# Patient Record
Sex: Female | Born: 1935 | Race: White | Hispanic: No | Smoking: Never smoker
Health system: Southern US, Community
[De-identification: ages and names within clinical notes are randomized; demographics above are authoritative.]

## PROBLEM LIST (undated history)

## (undated) DIAGNOSIS — R9089 Other abnormal findings on diagnostic imaging of central nervous system: Secondary | ICD-10-CM

## (undated) DIAGNOSIS — I639 Cerebral infarction, unspecified: Secondary | ICD-10-CM

## (undated) DIAGNOSIS — F0390 Unspecified dementia without behavioral disturbance: Secondary | ICD-10-CM

## (undated) DIAGNOSIS — I2510 Atherosclerotic heart disease of native coronary artery without angina pectoris: Secondary | ICD-10-CM

## (undated) DIAGNOSIS — G8190 Hemiplegia, unspecified affecting unspecified side: Secondary | ICD-10-CM

## (undated) DIAGNOSIS — J189 Pneumonia, unspecified organism: Secondary | ICD-10-CM

## (undated) DIAGNOSIS — R413 Other amnesia: Secondary | ICD-10-CM

## (undated) DIAGNOSIS — H3530 Unspecified macular degeneration: Secondary | ICD-10-CM

## (undated) DIAGNOSIS — G459 Transient cerebral ischemic attack, unspecified: Secondary | ICD-10-CM

## (undated) DIAGNOSIS — I6529 Occlusion and stenosis of unspecified carotid artery: Secondary | ICD-10-CM

## (undated) DIAGNOSIS — R931 Abnormal findings on diagnostic imaging of heart and coronary circulation: Secondary | ICD-10-CM

## (undated) DIAGNOSIS — E785 Hyperlipidemia, unspecified: Secondary | ICD-10-CM

## (undated) DIAGNOSIS — I10 Essential (primary) hypertension: Secondary | ICD-10-CM

## (undated) DIAGNOSIS — R930 Abnormal findings on diagnostic imaging of skull and head, not elsewhere classified: Secondary | ICD-10-CM

## (undated) DIAGNOSIS — L0390 Cellulitis, unspecified: Secondary | ICD-10-CM

## (undated) DIAGNOSIS — F329 Major depressive disorder, single episode, unspecified: Secondary | ICD-10-CM

## (undated) DIAGNOSIS — F32A Depression, unspecified: Secondary | ICD-10-CM

## (undated) HISTORY — DX: Other amnesia: R41.3

## (undated) HISTORY — PX: OTHER SURGICAL HISTORY: SHX169

## (undated) HISTORY — DX: Cellulitis, unspecified: L03.90

## (undated) HISTORY — DX: Pneumonia, unspecified organism: J18.9

## (undated) HISTORY — DX: Abnormal findings on diagnostic imaging of skull and head, not elsewhere classified: R93.0

## (undated) HISTORY — DX: Atherosclerotic heart disease of native coronary artery without angina pectoris: I25.10

## (undated) HISTORY — DX: Cerebral infarction, unspecified: I63.9

## (undated) HISTORY — PX: APPENDECTOMY: SHX54

## (undated) HISTORY — DX: Hyperlipidemia, unspecified: E78.5

## (undated) HISTORY — DX: Abnormal findings on diagnostic imaging of heart and coronary circulation: R93.1

## (undated) HISTORY — DX: Essential (primary) hypertension: I10

## (undated) HISTORY — DX: Unspecified macular degeneration: H35.30

## (undated) HISTORY — DX: Transient cerebral ischemic attack, unspecified: G45.9

## (undated) HISTORY — DX: Other abnormal findings on diagnostic imaging of central nervous system: R90.89

---

## 1958-03-11 DIAGNOSIS — I10 Essential (primary) hypertension: Secondary | ICD-10-CM

## 1958-03-11 HISTORY — DX: Essential (primary) hypertension: I10

## 1997-08-05 ENCOUNTER — Ambulatory Visit (HOSPITAL_COMMUNITY): Admission: RE | Admit: 1997-08-05 | Discharge: 1997-08-05 | Payer: Self-pay | Admitting: *Deleted

## 1997-09-01 ENCOUNTER — Ambulatory Visit (HOSPITAL_COMMUNITY): Admission: RE | Admit: 1997-09-01 | Discharge: 1997-09-01 | Payer: Self-pay | Admitting: Family Medicine

## 1998-08-11 ENCOUNTER — Ambulatory Visit (HOSPITAL_COMMUNITY): Admission: RE | Admit: 1998-08-11 | Discharge: 1998-08-11 | Payer: Self-pay | Admitting: *Deleted

## 1998-09-04 ENCOUNTER — Ambulatory Visit (HOSPITAL_COMMUNITY): Admission: RE | Admit: 1998-09-04 | Discharge: 1998-09-04 | Payer: Self-pay | Admitting: Family Medicine

## 1999-03-12 DIAGNOSIS — I639 Cerebral infarction, unspecified: Secondary | ICD-10-CM

## 1999-03-12 HISTORY — DX: Cerebral infarction, unspecified: I63.9

## 1999-04-27 ENCOUNTER — Inpatient Hospital Stay (HOSPITAL_COMMUNITY): Admission: EM | Admit: 1999-04-27 | Discharge: 1999-05-01 | Payer: Self-pay | Admitting: Emergency Medicine

## 1999-04-27 ENCOUNTER — Encounter: Payer: Self-pay | Admitting: Emergency Medicine

## 1999-04-28 ENCOUNTER — Encounter: Payer: Self-pay | Admitting: *Deleted

## 1999-07-27 ENCOUNTER — Ambulatory Visit (HOSPITAL_COMMUNITY): Admission: RE | Admit: 1999-07-27 | Discharge: 1999-07-27 | Payer: Self-pay | Admitting: Obstetrics & Gynecology

## 1999-09-20 ENCOUNTER — Ambulatory Visit (HOSPITAL_COMMUNITY): Admission: RE | Admit: 1999-09-20 | Discharge: 1999-09-20 | Payer: Self-pay | Admitting: Obstetrics & Gynecology

## 2000-04-07 ENCOUNTER — Encounter: Payer: Self-pay | Admitting: Orthopaedic Surgery

## 2000-04-07 ENCOUNTER — Ambulatory Visit (HOSPITAL_COMMUNITY): Admission: RE | Admit: 2000-04-07 | Discharge: 2000-04-07 | Payer: Self-pay | Admitting: Orthopaedic Surgery

## 2000-04-21 ENCOUNTER — Encounter: Admission: RE | Admit: 2000-04-21 | Discharge: 2000-05-14 | Payer: Self-pay | Admitting: Orthopaedic Surgery

## 2000-07-18 ENCOUNTER — Ambulatory Visit (HOSPITAL_COMMUNITY): Admission: RE | Admit: 2000-07-18 | Discharge: 2000-07-18 | Payer: Self-pay | Admitting: *Deleted

## 2000-09-22 ENCOUNTER — Encounter: Payer: Self-pay | Admitting: Family Medicine

## 2000-09-22 ENCOUNTER — Ambulatory Visit (HOSPITAL_COMMUNITY): Admission: RE | Admit: 2000-09-22 | Discharge: 2000-09-22 | Payer: Self-pay | Admitting: Family Medicine

## 2000-11-20 ENCOUNTER — Encounter: Payer: Self-pay | Admitting: Orthopaedic Surgery

## 2000-11-20 ENCOUNTER — Encounter: Admission: RE | Admit: 2000-11-20 | Discharge: 2000-11-20 | Payer: Self-pay | Admitting: Orthopaedic Surgery

## 2000-11-20 ENCOUNTER — Ambulatory Visit (HOSPITAL_BASED_OUTPATIENT_CLINIC_OR_DEPARTMENT_OTHER): Admission: RE | Admit: 2000-11-20 | Discharge: 2000-11-21 | Payer: Self-pay | Admitting: Orthopaedic Surgery

## 2000-12-08 ENCOUNTER — Encounter: Admission: RE | Admit: 2000-12-08 | Discharge: 2001-01-19 | Payer: Self-pay | Admitting: Orthopaedic Surgery

## 2001-09-29 ENCOUNTER — Ambulatory Visit (HOSPITAL_COMMUNITY): Admission: RE | Admit: 2001-09-29 | Discharge: 2001-09-29 | Payer: Self-pay | Admitting: Family Medicine

## 2001-09-29 ENCOUNTER — Encounter: Payer: Self-pay | Admitting: Family Medicine

## 2001-11-19 ENCOUNTER — Ambulatory Visit (HOSPITAL_COMMUNITY): Admission: RE | Admit: 2001-11-19 | Discharge: 2001-11-19 | Payer: Self-pay | Admitting: Obstetrics & Gynecology

## 2002-03-11 HISTORY — PX: SHOULDER SURGERY: SHX246

## 2002-03-12 ENCOUNTER — Encounter: Payer: Self-pay | Admitting: Family Medicine

## 2002-09-30 ENCOUNTER — Ambulatory Visit (HOSPITAL_COMMUNITY): Admission: RE | Admit: 2002-09-30 | Discharge: 2002-09-30 | Payer: Self-pay | Admitting: Family Medicine

## 2002-09-30 ENCOUNTER — Encounter: Payer: Self-pay | Admitting: Family Medicine

## 2003-08-20 ENCOUNTER — Ambulatory Visit (HOSPITAL_COMMUNITY): Admission: RE | Admit: 2003-08-20 | Discharge: 2003-08-20 | Payer: Self-pay | Admitting: Family Medicine

## 2003-09-15 ENCOUNTER — Ambulatory Visit (HOSPITAL_COMMUNITY): Admission: RE | Admit: 2003-09-15 | Discharge: 2003-09-15 | Payer: Self-pay | Admitting: Neurology

## 2003-09-15 ENCOUNTER — Ambulatory Visit: Admission: RE | Admit: 2003-09-15 | Discharge: 2003-09-15 | Payer: Self-pay | Admitting: Neurology

## 2003-09-15 ENCOUNTER — Encounter: Payer: Self-pay | Admitting: Cardiology

## 2003-09-15 DIAGNOSIS — R9089 Other abnormal findings on diagnostic imaging of central nervous system: Secondary | ICD-10-CM

## 2003-09-15 HISTORY — DX: Other abnormal findings on diagnostic imaging of central nervous system: R90.89

## 2004-01-25 ENCOUNTER — Ambulatory Visit: Payer: Self-pay | Admitting: Family Medicine

## 2004-02-09 DIAGNOSIS — E785 Hyperlipidemia, unspecified: Secondary | ICD-10-CM

## 2004-02-09 HISTORY — DX: Hyperlipidemia, unspecified: E78.5

## 2004-02-14 ENCOUNTER — Ambulatory Visit: Payer: Self-pay | Admitting: Family Medicine

## 2004-02-16 ENCOUNTER — Ambulatory Visit: Payer: Self-pay | Admitting: Family Medicine

## 2004-02-27 ENCOUNTER — Ambulatory Visit: Payer: Self-pay | Admitting: Cardiology

## 2004-02-29 ENCOUNTER — Ambulatory Visit (HOSPITAL_COMMUNITY): Admission: RE | Admit: 2004-02-29 | Discharge: 2004-02-29 | Payer: Self-pay | Admitting: Family Medicine

## 2004-03-02 ENCOUNTER — Ambulatory Visit: Payer: Self-pay

## 2004-03-07 ENCOUNTER — Ambulatory Visit: Payer: Self-pay

## 2004-03-08 ENCOUNTER — Ambulatory Visit: Payer: Self-pay | Admitting: Family Medicine

## 2004-03-08 HISTORY — PX: OTHER SURGICAL HISTORY: SHX169

## 2004-03-16 ENCOUNTER — Ambulatory Visit: Payer: Self-pay | Admitting: Family Medicine

## 2004-03-27 ENCOUNTER — Encounter: Admission: RE | Admit: 2004-03-27 | Discharge: 2004-03-27 | Payer: Self-pay | Admitting: Family Medicine

## 2004-04-02 ENCOUNTER — Ambulatory Visit: Payer: Self-pay | Admitting: Cardiology

## 2004-04-06 ENCOUNTER — Ambulatory Visit: Payer: Self-pay | Admitting: Cardiology

## 2004-04-09 ENCOUNTER — Inpatient Hospital Stay (HOSPITAL_BASED_OUTPATIENT_CLINIC_OR_DEPARTMENT_OTHER): Admission: RE | Admit: 2004-04-09 | Discharge: 2004-04-09 | Payer: Self-pay | Admitting: Cardiology

## 2004-04-09 HISTORY — PX: CARDIAC CATHETERIZATION: SHX172

## 2004-04-16 ENCOUNTER — Ambulatory Visit: Payer: Self-pay | Admitting: *Deleted

## 2004-04-30 ENCOUNTER — Inpatient Hospital Stay (HOSPITAL_COMMUNITY): Admission: EM | Admit: 2004-04-30 | Discharge: 2004-05-03 | Payer: Self-pay | Admitting: Emergency Medicine

## 2004-04-30 ENCOUNTER — Ambulatory Visit: Payer: Self-pay | Admitting: Internal Medicine

## 2004-05-02 HISTORY — PX: CARDIAC CATHETERIZATION: SHX172

## 2004-05-17 ENCOUNTER — Ambulatory Visit: Payer: Self-pay | Admitting: Cardiology

## 2004-06-05 ENCOUNTER — Ambulatory Visit: Payer: Self-pay | Admitting: Family Medicine

## 2004-07-18 ENCOUNTER — Ambulatory Visit: Payer: Self-pay | Admitting: Cardiology

## 2004-07-18 ENCOUNTER — Inpatient Hospital Stay (HOSPITAL_COMMUNITY): Admission: EM | Admit: 2004-07-18 | Discharge: 2004-07-28 | Payer: Self-pay | Admitting: Emergency Medicine

## 2004-07-19 HISTORY — PX: CARDIAC CATHETERIZATION: SHX172

## 2004-07-20 HISTORY — PX: OTHER SURGICAL HISTORY: SHX169

## 2004-08-09 ENCOUNTER — Encounter: Payer: Self-pay | Admitting: Family Medicine

## 2004-08-09 LAB — CONVERTED CEMR LAB: Hgb A1c MFr Bld: 5.4 %

## 2004-08-10 ENCOUNTER — Ambulatory Visit: Payer: Self-pay | Admitting: Cardiology

## 2004-08-22 ENCOUNTER — Ambulatory Visit: Payer: Self-pay

## 2004-08-31 ENCOUNTER — Ambulatory Visit: Payer: Self-pay | Admitting: Family Medicine

## 2004-09-05 ENCOUNTER — Ambulatory Visit: Payer: Self-pay | Admitting: Family Medicine

## 2004-10-08 ENCOUNTER — Ambulatory Visit: Payer: Self-pay | Admitting: Family Medicine

## 2004-10-11 ENCOUNTER — Ambulatory Visit: Payer: Self-pay | Admitting: Cardiology

## 2004-10-17 ENCOUNTER — Ambulatory Visit: Payer: Self-pay

## 2004-10-17 HISTORY — PX: OTHER SURGICAL HISTORY: SHX169

## 2004-11-14 ENCOUNTER — Ambulatory Visit: Payer: Self-pay | Admitting: Cardiology

## 2004-12-10 ENCOUNTER — Ambulatory Visit: Payer: Self-pay | Admitting: Cardiology

## 2004-12-20 ENCOUNTER — Ambulatory Visit: Payer: Self-pay | Admitting: Family Medicine

## 2005-01-07 ENCOUNTER — Ambulatory Visit: Payer: Self-pay | Admitting: Family Medicine

## 2005-02-08 ENCOUNTER — Ambulatory Visit: Payer: Self-pay | Admitting: Cardiology

## 2005-03-13 ENCOUNTER — Ambulatory Visit: Payer: Self-pay | Admitting: Cardiology

## 2005-06-21 ENCOUNTER — Ambulatory Visit: Payer: Self-pay | Admitting: Family Medicine

## 2005-06-25 ENCOUNTER — Ambulatory Visit: Payer: Self-pay | Admitting: Family Medicine

## 2005-07-16 ENCOUNTER — Ambulatory Visit: Payer: Self-pay | Admitting: Family Medicine

## 2005-07-30 ENCOUNTER — Encounter: Admission: RE | Admit: 2005-07-30 | Discharge: 2005-07-30 | Payer: Self-pay | Admitting: Family Medicine

## 2005-08-06 ENCOUNTER — Ambulatory Visit: Payer: Self-pay | Admitting: Family Medicine

## 2005-08-20 ENCOUNTER — Encounter: Admission: RE | Admit: 2005-08-20 | Discharge: 2005-08-20 | Payer: Self-pay | Admitting: Family Medicine

## 2005-09-04 ENCOUNTER — Ambulatory Visit: Payer: Self-pay | Admitting: Cardiology

## 2005-09-10 ENCOUNTER — Ambulatory Visit: Payer: Self-pay

## 2005-09-10 HISTORY — PX: OTHER SURGICAL HISTORY: SHX169

## 2005-09-30 ENCOUNTER — Ambulatory Visit: Payer: Self-pay | Admitting: Family Medicine

## 2005-10-28 ENCOUNTER — Ambulatory Visit: Payer: Self-pay | Admitting: Family Medicine

## 2005-12-06 ENCOUNTER — Ambulatory Visit: Payer: Self-pay | Admitting: Family Medicine

## 2006-02-11 ENCOUNTER — Ambulatory Visit: Payer: Self-pay | Admitting: Cardiology

## 2006-03-10 ENCOUNTER — Ambulatory Visit: Payer: Self-pay | Admitting: Family Medicine

## 2006-04-24 ENCOUNTER — Ambulatory Visit: Payer: Self-pay | Admitting: Family Medicine

## 2006-07-15 ENCOUNTER — Ambulatory Visit: Payer: Self-pay | Admitting: Family Medicine

## 2006-07-15 DIAGNOSIS — I10 Essential (primary) hypertension: Secondary | ICD-10-CM

## 2006-07-15 DIAGNOSIS — R413 Other amnesia: Secondary | ICD-10-CM | POA: Insufficient documentation

## 2006-07-15 DIAGNOSIS — E7800 Pure hypercholesterolemia, unspecified: Secondary | ICD-10-CM

## 2006-07-15 DIAGNOSIS — Z87898 Personal history of other specified conditions: Secondary | ICD-10-CM

## 2006-09-03 ENCOUNTER — Ambulatory Visit: Payer: Self-pay | Admitting: Cardiology

## 2006-09-10 ENCOUNTER — Encounter: Payer: Self-pay | Admitting: Family Medicine

## 2006-09-10 DIAGNOSIS — E119 Type 2 diabetes mellitus without complications: Secondary | ICD-10-CM | POA: Insufficient documentation

## 2006-09-11 DIAGNOSIS — Z8679 Personal history of other diseases of the circulatory system: Secondary | ICD-10-CM

## 2006-09-15 ENCOUNTER — Ambulatory Visit: Payer: Self-pay | Admitting: Family Medicine

## 2006-09-15 LAB — CONVERTED CEMR LAB
ALT: 15 units/L (ref 0–35)
AST: 22 units/L (ref 0–37)
Albumin: 3.7 g/dL (ref 3.5–5.2)
BUN: 16 mg/dL (ref 6–23)
Basophils Absolute: 0.1 10*3/uL (ref 0.0–0.1)
Calcium: 9.7 mg/dL (ref 8.4–10.5)
Chloride: 112 meq/L (ref 96–112)
Eosinophils Absolute: 0.2 10*3/uL (ref 0.0–0.6)
GFR calc Af Amer: 62 mL/min
GFR calc non Af Amer: 51 mL/min
HDL: 36.4 mg/dL — ABNORMAL LOW (ref >39.0)
Hgb A1c MFr Bld: 6.3 % — ABNORMAL HIGH (ref 4.6–6.0)
MCHC: 33.7 g/dL (ref 30.0–36.0)
MCV: 92.7 fL (ref 78.0–100.0)
Microalb Creat Ratio: 91.6 mg/g — ABNORMAL HIGH (ref 0.0–30.0)
Monocytes Relative: 11.5 % — ABNORMAL HIGH (ref 3.0–11.0)
Neutro Abs: 3.8 10*3/uL (ref 1.4–7.7)
Platelets: 181 10*3/uL (ref 150–400)
RBC: 4.36 M/uL (ref 3.87–5.11)
TSH: 2.61 microintl units/mL (ref 0.35–5.50)
Total CHOL/HDL Ratio: 3.8
Triglycerides: 152 mg/dL — ABNORMAL HIGH (ref 0–149)
WBC: 5.7 10*3/uL (ref 4.5–10.5)

## 2006-09-16 ENCOUNTER — Ambulatory Visit: Payer: Self-pay | Admitting: Cardiology

## 2006-09-16 ENCOUNTER — Encounter: Payer: Self-pay | Admitting: Family Medicine

## 2006-09-16 HISTORY — PX: OTHER SURGICAL HISTORY: SHX169

## 2006-09-16 LAB — CONVERTED CEMR LAB
AST: 24 units/L (ref 0–37)
Albumin: 3.7 g/dL (ref 3.5–5.2)
Alkaline Phosphatase: 52 units/L (ref 39–117)
BUN: 16 mg/dL (ref 6–23)
CO2: 30 meq/L (ref 19–32)
Chloride: 107 meq/L (ref 96–112)
Creatinine, Ser: 1 mg/dL (ref 0.4–1.2)
HDL: 36.4 mg/dL — ABNORMAL LOW (ref >39.0)
Potassium: 4.3 meq/L (ref 3.5–5.1)
Sodium: 144 meq/L (ref 135–145)
Total Bilirubin: 1 mg/dL (ref 0.3–1.2)
Total Protein: 6.8 g/dL (ref 6.0–8.3)
Triglycerides: 141 mg/dL (ref 0–149)
VLDL: 28 mg/dL (ref 0–40)

## 2006-09-18 ENCOUNTER — Ambulatory Visit: Payer: Self-pay | Admitting: Family Medicine

## 2006-11-04 ENCOUNTER — Encounter: Payer: Self-pay | Admitting: Family Medicine

## 2006-11-14 ENCOUNTER — Encounter: Admission: RE | Admit: 2006-11-14 | Discharge: 2006-11-14 | Payer: Self-pay | Admitting: Family Medicine

## 2006-11-18 ENCOUNTER — Encounter (INDEPENDENT_AMBULATORY_CARE_PROVIDER_SITE_OTHER): Payer: Self-pay | Admitting: *Deleted

## 2006-12-23 ENCOUNTER — Ambulatory Visit: Payer: Self-pay | Admitting: Family Medicine

## 2007-01-09 ENCOUNTER — Encounter: Payer: Self-pay | Admitting: Family Medicine

## 2007-01-09 ENCOUNTER — Ambulatory Visit: Payer: Self-pay | Admitting: Cardiology

## 2007-02-16 ENCOUNTER — Telehealth: Payer: Self-pay | Admitting: Family Medicine

## 2007-04-17 ENCOUNTER — Ambulatory Visit: Payer: Self-pay | Admitting: Family Medicine

## 2007-04-20 ENCOUNTER — Ambulatory Visit: Payer: Self-pay | Admitting: Family Medicine

## 2007-05-20 ENCOUNTER — Telehealth: Payer: Self-pay | Admitting: Family Medicine

## 2007-08-20 ENCOUNTER — Telehealth: Payer: Self-pay | Admitting: Family Medicine

## 2007-11-06 ENCOUNTER — Inpatient Hospital Stay (HOSPITAL_COMMUNITY): Admission: EM | Admit: 2007-11-06 | Discharge: 2007-11-10 | Payer: Self-pay | Admitting: Emergency Medicine

## 2007-11-06 ENCOUNTER — Ambulatory Visit: Payer: Self-pay | Admitting: Internal Medicine

## 2007-11-06 DIAGNOSIS — R930 Abnormal findings on diagnostic imaging of skull and head, not elsewhere classified: Secondary | ICD-10-CM

## 2007-11-06 DIAGNOSIS — Z8673 Personal history of transient ischemic attack (TIA), and cerebral infarction without residual deficits: Secondary | ICD-10-CM

## 2007-11-06 HISTORY — DX: Abnormal findings on diagnostic imaging of skull and head, not elsewhere classified: R93.0

## 2007-11-08 DIAGNOSIS — R930 Abnormal findings on diagnostic imaging of skull and head, not elsewhere classified: Secondary | ICD-10-CM

## 2007-11-08 HISTORY — DX: Abnormal findings on diagnostic imaging of skull and head, not elsewhere classified: R93.0

## 2007-11-09 ENCOUNTER — Encounter: Payer: Self-pay | Admitting: Internal Medicine

## 2007-11-09 HISTORY — PX: OTHER SURGICAL HISTORY: SHX169

## 2007-11-10 ENCOUNTER — Encounter: Payer: Self-pay | Admitting: Family Medicine

## 2007-11-10 DIAGNOSIS — G459 Transient cerebral ischemic attack, unspecified: Secondary | ICD-10-CM

## 2007-11-10 DIAGNOSIS — J189 Pneumonia, unspecified organism: Secondary | ICD-10-CM

## 2007-11-10 HISTORY — DX: Pneumonia, unspecified organism: J18.9

## 2007-11-10 HISTORY — DX: Transient cerebral ischemic attack, unspecified: G45.9

## 2007-11-11 ENCOUNTER — Telehealth: Payer: Self-pay | Admitting: Family Medicine

## 2007-11-19 ENCOUNTER — Encounter: Payer: Self-pay | Admitting: Family Medicine

## 2007-11-23 ENCOUNTER — Ambulatory Visit: Payer: Self-pay | Admitting: Family Medicine

## 2007-11-24 ENCOUNTER — Ambulatory Visit: Payer: Self-pay | Admitting: Family Medicine

## 2007-11-25 ENCOUNTER — Encounter: Admission: RE | Admit: 2007-11-25 | Discharge: 2007-11-25 | Payer: Self-pay | Admitting: Family Medicine

## 2007-11-26 ENCOUNTER — Encounter (INDEPENDENT_AMBULATORY_CARE_PROVIDER_SITE_OTHER): Payer: Self-pay | Admitting: *Deleted

## 2007-11-26 ENCOUNTER — Encounter: Payer: Self-pay | Admitting: Family Medicine

## 2007-12-02 ENCOUNTER — Ambulatory Visit: Payer: Self-pay | Admitting: Family Medicine

## 2007-12-03 ENCOUNTER — Encounter: Payer: Self-pay | Admitting: Family Medicine

## 2007-12-08 DIAGNOSIS — R930 Abnormal findings on diagnostic imaging of skull and head, not elsewhere classified: Secondary | ICD-10-CM

## 2007-12-08 HISTORY — DX: Abnormal findings on diagnostic imaging of skull and head, not elsewhere classified: R93.0

## 2008-01-07 ENCOUNTER — Encounter: Payer: Self-pay | Admitting: Family Medicine

## 2008-02-05 ENCOUNTER — Telehealth: Payer: Self-pay | Admitting: Family Medicine

## 2008-02-23 ENCOUNTER — Encounter: Payer: Self-pay | Admitting: Family Medicine

## 2008-02-25 ENCOUNTER — Ambulatory Visit: Payer: Self-pay | Admitting: Family Medicine

## 2008-02-25 DIAGNOSIS — R609 Edema, unspecified: Secondary | ICD-10-CM | POA: Insufficient documentation

## 2008-03-28 ENCOUNTER — Ambulatory Visit: Payer: Self-pay | Admitting: Family Medicine

## 2008-03-28 ENCOUNTER — Telehealth: Payer: Self-pay | Admitting: Family Medicine

## 2008-03-28 LAB — CONVERTED CEMR LAB
CO2: 31 meq/L (ref 19–32)
Calcium: 9.3 mg/dL (ref 8.4–10.5)
Creatinine, Ser: 1 mg/dL (ref 0.4–1.2)
GFR calc Af Amer: 69 mL/min
Glucose, Bld: 100 mg/dL — ABNORMAL HIGH (ref 70–99)
Sodium: 142 meq/L (ref 135–145)

## 2008-04-25 ENCOUNTER — Encounter: Payer: Self-pay | Admitting: Family Medicine

## 2008-04-27 ENCOUNTER — Ambulatory Visit: Payer: Self-pay | Admitting: Family Medicine

## 2008-04-29 ENCOUNTER — Ambulatory Visit: Payer: Self-pay | Admitting: Cardiology

## 2008-05-04 ENCOUNTER — Encounter: Payer: Self-pay | Admitting: Family Medicine

## 2008-05-11 ENCOUNTER — Encounter: Payer: Self-pay | Admitting: Family Medicine

## 2008-05-11 ENCOUNTER — Ambulatory Visit: Payer: Self-pay

## 2008-05-11 ENCOUNTER — Encounter: Payer: Self-pay | Admitting: Cardiology

## 2008-05-11 HISTORY — PX: OTHER SURGICAL HISTORY: SHX169

## 2008-05-11 LAB — CONVERTED CEMR LAB
ALT: 18 units/L (ref 0–35)
AST: 26 units/L (ref 0–37)
Alkaline Phosphatase: 48 units/L (ref 39–117)
Bilirubin, Direct: 0.1 mg/dL (ref 0.0–0.3)
CO2: 27 meq/L (ref 19–32)
Chloride: 110 meq/L (ref 96–112)
GFR calc non Af Amer: 51 mL/min
LDL Cholesterol: 55 mg/dL (ref 0–99)
Potassium: 4.4 meq/L (ref 3.5–5.1)
Sodium: 142 meq/L (ref 135–145)
Total Bilirubin: 0.7 mg/dL (ref 0.3–1.2)
Total CHOL/HDL Ratio: 2.7

## 2008-07-04 ENCOUNTER — Encounter: Payer: Self-pay | Admitting: Family Medicine

## 2008-07-19 ENCOUNTER — Ambulatory Visit: Payer: Self-pay | Admitting: Family Medicine

## 2008-08-09 ENCOUNTER — Ambulatory Visit: Payer: Self-pay | Admitting: Family Medicine

## 2008-08-09 LAB — CONVERTED CEMR LAB
ALT: 12 units/L (ref 0–35)
AST: 21 units/L (ref 0–37)
BUN: 18 mg/dL (ref 6–23)
Basophils Relative: 1.5 % (ref 0.0–3.0)
Bilirubin, Direct: 0.2 mg/dL (ref 0.0–0.3)
Chloride: 115 meq/L — ABNORMAL HIGH (ref 96–112)
Cholesterol: 115 mg/dL (ref 0–200)
Eosinophils Relative: 6.3 % — ABNORMAL HIGH (ref 0.0–5.0)
GFR calc non Af Amer: 50.63 mL/min (ref >60)
HCT: 36.2 % (ref 36.0–46.0)
Hgb A1c MFr Bld: 6.2 % (ref 4.6–6.5)
LDL Cholesterol: 51 mg/dL (ref 0–99)
Lymphs Abs: 0.6 10*3/uL — ABNORMAL LOW (ref 0.7–4.0)
Monocytes Relative: 15.6 % — ABNORMAL HIGH (ref 3.0–12.0)
Neutrophils Relative %: 64.5 % (ref 43.0–77.0)
Platelets: 159 10*3/uL (ref 150.0–400.0)
Potassium: 4.2 meq/L (ref 3.5–5.1)
RBC: 3.79 M/uL — ABNORMAL LOW (ref 3.87–5.11)
Total Bilirubin: 0.8 mg/dL (ref 0.3–1.2)
Total CHOL/HDL Ratio: 3
Total Protein: 6.8 g/dL (ref 6.0–8.3)
VLDL: 19.6 mg/dL (ref 0.0–40.0)
WBC: 4.8 10*3/uL (ref 4.5–10.5)

## 2008-08-10 LAB — CONVERTED CEMR LAB: Vit D, 25-Hydroxy: 23 ng/mL — ABNORMAL LOW (ref 30–89)

## 2008-08-15 ENCOUNTER — Ambulatory Visit: Payer: Self-pay | Admitting: Family Medicine

## 2008-08-15 ENCOUNTER — Telehealth: Payer: Self-pay | Admitting: Family Medicine

## 2008-10-26 ENCOUNTER — Ambulatory Visit: Payer: Self-pay | Admitting: Obstetrics & Gynecology

## 2008-10-26 ENCOUNTER — Ambulatory Visit: Payer: Self-pay | Admitting: Cardiovascular Disease

## 2008-10-26 HISTORY — PX: OTHER SURGICAL HISTORY: SHX169

## 2008-10-27 ENCOUNTER — Encounter: Payer: Self-pay | Admitting: Family Medicine

## 2008-11-03 ENCOUNTER — Telehealth: Payer: Self-pay | Admitting: Cardiology

## 2008-11-10 ENCOUNTER — Ambulatory Visit: Payer: Self-pay | Admitting: Family Medicine

## 2008-11-10 DIAGNOSIS — R252 Cramp and spasm: Secondary | ICD-10-CM

## 2008-11-10 DIAGNOSIS — K219 Gastro-esophageal reflux disease without esophagitis: Secondary | ICD-10-CM

## 2008-11-21 ENCOUNTER — Telehealth (INDEPENDENT_AMBULATORY_CARE_PROVIDER_SITE_OTHER): Payer: Self-pay | Admitting: *Deleted

## 2008-11-28 ENCOUNTER — Encounter: Payer: Self-pay | Admitting: Family Medicine

## 2008-12-12 ENCOUNTER — Encounter: Payer: Self-pay | Admitting: Family Medicine

## 2008-12-13 ENCOUNTER — Encounter: Payer: Self-pay | Admitting: Family Medicine

## 2008-12-21 ENCOUNTER — Encounter: Payer: Self-pay | Admitting: Family Medicine

## 2008-12-26 ENCOUNTER — Encounter: Payer: Self-pay | Admitting: Family Medicine

## 2009-01-23 ENCOUNTER — Encounter: Payer: Self-pay | Admitting: Family Medicine

## 2009-01-23 ENCOUNTER — Telehealth: Payer: Self-pay | Admitting: Family Medicine

## 2009-02-09 ENCOUNTER — Ambulatory Visit: Payer: Self-pay | Admitting: Family Medicine

## 2009-02-09 LAB — CONVERTED CEMR LAB
Chloride: 108 meq/L (ref 96–112)
Hgb A1c MFr Bld: 6.2 % (ref 4.6–6.5)
Potassium: 4.4 meq/L (ref 3.5–5.1)

## 2009-02-16 ENCOUNTER — Ambulatory Visit: Payer: Self-pay | Admitting: Family Medicine

## 2009-02-28 ENCOUNTER — Ambulatory Visit: Payer: Self-pay | Admitting: Family Medicine

## 2009-02-28 ENCOUNTER — Telehealth: Payer: Self-pay | Admitting: Family Medicine

## 2009-03-02 ENCOUNTER — Telehealth: Payer: Self-pay | Admitting: Family Medicine

## 2009-03-02 ENCOUNTER — Ambulatory Visit: Payer: Self-pay | Admitting: Family Medicine

## 2009-03-07 ENCOUNTER — Ambulatory Visit: Payer: Self-pay | Admitting: Family Medicine

## 2009-03-14 ENCOUNTER — Telehealth: Payer: Self-pay | Admitting: Family Medicine

## 2009-03-22 ENCOUNTER — Telehealth: Payer: Self-pay | Admitting: Family Medicine

## 2009-04-03 ENCOUNTER — Telehealth: Payer: Self-pay | Admitting: Family Medicine

## 2009-04-04 ENCOUNTER — Encounter: Payer: Self-pay | Admitting: Family Medicine

## 2009-04-10 ENCOUNTER — Telehealth: Payer: Self-pay | Admitting: Family Medicine

## 2009-04-13 ENCOUNTER — Telehealth: Payer: Self-pay | Admitting: Family Medicine

## 2009-04-17 ENCOUNTER — Telehealth: Payer: Self-pay | Admitting: Family Medicine

## 2009-04-24 ENCOUNTER — Telehealth: Payer: Self-pay | Admitting: Family Medicine

## 2009-05-01 ENCOUNTER — Ambulatory Visit: Payer: Self-pay | Admitting: Family Medicine

## 2009-05-01 LAB — CONVERTED CEMR LAB
CO2: 29 meq/L (ref 19–32)
Chloride: 110 meq/L (ref 96–112)
Potassium: 4.3 meq/L (ref 3.5–5.1)
Sodium: 144 meq/L (ref 135–145)

## 2009-05-08 ENCOUNTER — Ambulatory Visit: Payer: Self-pay | Admitting: Family Medicine

## 2009-05-11 ENCOUNTER — Encounter: Payer: Self-pay | Admitting: Family Medicine

## 2009-05-30 ENCOUNTER — Encounter: Payer: Self-pay | Admitting: Family Medicine

## 2009-06-01 ENCOUNTER — Telehealth: Payer: Self-pay | Admitting: Family Medicine

## 2009-07-09 DIAGNOSIS — L0390 Cellulitis, unspecified: Secondary | ICD-10-CM

## 2009-07-09 HISTORY — DX: Cellulitis, unspecified: L03.90

## 2009-07-27 ENCOUNTER — Encounter: Payer: Self-pay | Admitting: Family Medicine

## 2009-08-07 ENCOUNTER — Emergency Department (HOSPITAL_COMMUNITY): Admission: EM | Admit: 2009-08-07 | Discharge: 2009-08-07 | Payer: Self-pay | Admitting: Family Medicine

## 2009-08-07 ENCOUNTER — Inpatient Hospital Stay (HOSPITAL_COMMUNITY): Admission: EM | Admit: 2009-08-07 | Discharge: 2009-08-08 | Payer: Self-pay | Admitting: Emergency Medicine

## 2009-08-08 ENCOUNTER — Telehealth: Payer: Self-pay | Admitting: Family Medicine

## 2009-08-08 ENCOUNTER — Encounter: Payer: Self-pay | Admitting: Family Medicine

## 2009-08-21 ENCOUNTER — Telehealth: Payer: Self-pay | Admitting: Family Medicine

## 2009-08-28 ENCOUNTER — Ambulatory Visit: Payer: Self-pay | Admitting: Family Medicine

## 2009-08-29 LAB — CONVERTED CEMR LAB
AST: 23 units/L (ref 0–37)
BUN: 17 mg/dL (ref 6–23)
Basophils Absolute: 0 10*3/uL (ref 0.0–0.1)
Calcium: 9.6 mg/dL (ref 8.4–10.5)
Cholesterol: 116 mg/dL (ref 0–200)
Creatinine,U: 199.5 mg/dL
GFR calc non Af Amer: 51.57 mL/min (ref >60)
Glucose, Bld: 110 mg/dL — ABNORMAL HIGH (ref 70–99)
HCT: 36.4 % (ref 36.0–46.0)
HDL: 43.7 mg/dL (ref >39.00)
Hgb A1c MFr Bld: 6.3 % (ref 4.6–6.5)
Lymphs Abs: 0.8 10*3/uL (ref 0.7–4.0)
Microalb, Ur: 135.2 mg/dL — ABNORMAL HIGH (ref 0.0–1.9)
Monocytes Absolute: 0.6 10*3/uL (ref 0.1–1.0)
Monocytes Relative: 10.6 % (ref 3.0–12.0)
Platelets: 157 10*3/uL (ref 150.0–400.0)
Potassium: 4.6 meq/L (ref 3.5–5.1)
RDW: 13.4 % (ref 11.5–14.6)
TSH: 3.4 microintl units/mL (ref 0.35–5.50)
Total Bilirubin: 0.6 mg/dL (ref 0.3–1.2)
Triglycerides: 103 mg/dL (ref 0.0–149.0)
Uric Acid, Serum: 8 mg/dL — ABNORMAL HIGH (ref 2.4–7.0)
VLDL: 20.6 mg/dL (ref 0.0–40.0)
Vit D, 25-Hydroxy: 55 ng/mL (ref 30–89)

## 2009-08-31 ENCOUNTER — Ambulatory Visit: Payer: Self-pay | Admitting: Family Medicine

## 2009-08-31 LAB — HM DIABETES FOOT EXAM

## 2009-09-04 ENCOUNTER — Encounter: Payer: Self-pay | Admitting: Family Medicine

## 2009-09-24 ENCOUNTER — Encounter: Payer: Self-pay | Admitting: Family Medicine

## 2009-09-27 ENCOUNTER — Encounter: Payer: Self-pay | Admitting: Family Medicine

## 2009-09-29 ENCOUNTER — Encounter: Payer: Self-pay | Admitting: Family Medicine

## 2009-09-29 ENCOUNTER — Ambulatory Visit: Payer: Self-pay | Admitting: Family Medicine

## 2009-10-02 ENCOUNTER — Telehealth: Payer: Self-pay | Admitting: Family Medicine

## 2009-10-04 ENCOUNTER — Encounter: Payer: Self-pay | Admitting: Family Medicine

## 2009-10-06 ENCOUNTER — Encounter: Payer: Self-pay | Admitting: Family Medicine

## 2009-10-11 ENCOUNTER — Encounter (INDEPENDENT_AMBULATORY_CARE_PROVIDER_SITE_OTHER): Payer: Self-pay | Admitting: *Deleted

## 2009-10-12 ENCOUNTER — Encounter: Payer: Self-pay | Admitting: Family Medicine

## 2009-10-18 ENCOUNTER — Encounter: Payer: Self-pay | Admitting: Family Medicine

## 2009-10-18 ENCOUNTER — Ambulatory Visit: Payer: Self-pay | Admitting: Family Medicine

## 2009-10-18 LAB — HM MAMMOGRAPHY: HM Mammogram: NORMAL

## 2009-10-19 ENCOUNTER — Encounter (INDEPENDENT_AMBULATORY_CARE_PROVIDER_SITE_OTHER): Payer: Self-pay | Admitting: *Deleted

## 2009-10-24 ENCOUNTER — Encounter: Payer: Self-pay | Admitting: Family Medicine

## 2009-10-24 ENCOUNTER — Telehealth: Payer: Self-pay | Admitting: Family Medicine

## 2009-10-27 ENCOUNTER — Telehealth: Payer: Self-pay | Admitting: Family Medicine

## 2009-11-20 ENCOUNTER — Telehealth: Payer: Self-pay | Admitting: Family Medicine

## 2009-12-05 ENCOUNTER — Encounter: Payer: Self-pay | Admitting: Family Medicine

## 2009-12-13 ENCOUNTER — Telehealth: Payer: Self-pay | Admitting: Family Medicine

## 2009-12-13 ENCOUNTER — Encounter (INDEPENDENT_AMBULATORY_CARE_PROVIDER_SITE_OTHER): Payer: Self-pay | Admitting: *Deleted

## 2009-12-14 ENCOUNTER — Telehealth: Payer: Self-pay | Admitting: Family Medicine

## 2009-12-15 ENCOUNTER — Encounter: Payer: Self-pay | Admitting: Family Medicine

## 2009-12-18 ENCOUNTER — Encounter: Payer: Self-pay | Admitting: Family Medicine

## 2010-02-07 ENCOUNTER — Encounter: Payer: Self-pay | Admitting: Family Medicine

## 2010-02-08 ENCOUNTER — Encounter: Payer: Self-pay | Admitting: Family Medicine

## 2010-02-20 ENCOUNTER — Encounter (INDEPENDENT_AMBULATORY_CARE_PROVIDER_SITE_OTHER): Payer: Self-pay | Admitting: *Deleted

## 2010-02-20 ENCOUNTER — Telehealth: Payer: Self-pay | Admitting: Family Medicine

## 2010-02-26 ENCOUNTER — Telehealth: Payer: Self-pay | Admitting: Family Medicine

## 2010-02-26 ENCOUNTER — Encounter: Payer: Self-pay | Admitting: Family Medicine

## 2010-02-26 ENCOUNTER — Encounter (INDEPENDENT_AMBULATORY_CARE_PROVIDER_SITE_OTHER): Payer: Self-pay | Admitting: Internal Medicine

## 2010-02-26 ENCOUNTER — Ambulatory Visit: Payer: Self-pay | Admitting: Internal Medicine

## 2010-03-02 ENCOUNTER — Telehealth: Payer: Self-pay | Admitting: Family Medicine

## 2010-03-14 ENCOUNTER — Encounter: Payer: Self-pay | Admitting: Family Medicine

## 2010-03-29 ENCOUNTER — Encounter: Payer: Self-pay | Admitting: Family Medicine

## 2010-04-01 ENCOUNTER — Encounter: Payer: Self-pay | Admitting: Family Medicine

## 2010-04-03 ENCOUNTER — Telehealth: Payer: Self-pay | Admitting: Family Medicine

## 2010-04-03 ENCOUNTER — Encounter: Payer: Self-pay | Admitting: Family Medicine

## 2010-04-03 ENCOUNTER — Emergency Department: Payer: Medicare Other | Admitting: Emergency Medicine

## 2010-04-03 ENCOUNTER — Encounter (INDEPENDENT_AMBULATORY_CARE_PROVIDER_SITE_OTHER): Payer: Self-pay | Admitting: *Deleted

## 2010-04-04 ENCOUNTER — Encounter: Payer: Self-pay | Admitting: Family Medicine

## 2010-04-04 ENCOUNTER — Telehealth: Payer: Self-pay | Admitting: Family Medicine

## 2010-04-09 ENCOUNTER — Ambulatory Visit
Admission: RE | Admit: 2010-04-09 | Discharge: 2010-04-09 | Payer: Self-pay | Source: Home / Self Care | Attending: Family Medicine | Admitting: Family Medicine

## 2010-04-11 ENCOUNTER — Telehealth: Payer: Self-pay | Admitting: Family Medicine

## 2010-04-12 ENCOUNTER — Ambulatory Visit: Payer: Self-pay | Admitting: Family Medicine

## 2010-04-12 NOTE — Miscellaneous (Signed)
Summary: Fax Regarding Labs/[facility]  Fax Regarding Labs/[facility]   Imported By: Lanelle Bal 03/21/2010 12:46:35  _____________________________________________________________________  External Attachment:    Type:   Image     Comment:   External Document

## 2010-04-12 NOTE — Assessment & Plan Note (Signed)
Summary: POSSIBLE UTI/JBB   Vital Signs:  Patient Profile:   77 Years Old Female CC:      Possible UTI Height:     63.0 inches Weight:      163 pounds BMI:     28.98 O2 Sat:      97 % Temp:     98.2 degrees F oral Pulse rate:   71 / minute Pulse rhythm:   regular Resp:     18 per minute BP sitting:   198 / 80  (left arm)  Pt. in pain?   no  Vitals Entered By: Levonne Spiller EMT-P (February 26, 2010 11:34 AM)              Is Patient Diabetic? No  Does patient need assistance? Ambulation Impaired:Risk for fall      Current Allergies: ! MORPHINE SULFATE (MORPHINE SULFATE)History of Present Illness Chief Complaint: Possible UTI since yesterday. History of Present Illness: patient is demented and is brought here by a daughter. patient had an emotional outburst last pm evidently thought unusual for primary dx. Last time the cause ended up being an uti. patient denies any symptoms such as freq, burning, fever, chills, n/v, back pain.  REVIEW OF SYSTEMS Constitutional Symptoms      Denies fever, chills, night sweats, weight loss, weight gain, and fatigue.      Comments: sleeps flat 1 pillow Eyes       Denies change in vision, eye pain, eye discharge, glasses, contact lenses, and eye surgery.      Comments: chronic vision problems unchanged Ear/Nose/Throat/Mouth       Denies hearing loss/aids, change in hearing, ear pain, ear discharge, dizziness, frequent runny nose, frequent nose bleeds, sinus problems, sore throat, hoarseness, and tooth pain or bleeding.  Respiratory       Denies dry cough, productive cough, wheezing, shortness of breath, asthma, bronchitis, and emphysema/COPD.  Cardiovascular       Denies murmurs, chest pain, and tires easily with exhertion.    Gastrointestinal       Denies stomach pain, nausea/vomiting, diarrhea, constipation, blood in bowel movements, and indigestion. Genitourniary       Denies painful urination, blood or discharge from vagina, kidney  stones, and loss of urinary control. Neurological       Denies paralysis, seizures, and fainting/blackouts. Musculoskeletal       Denies muscle pain, joint pain, joint stiffness, decreased range of motion, redness, swelling, muscle weakness, and gout.  Skin       Denies bruising, unusual mles/lumps or sores, and hair/skin or nail changes.  Psych       Complains of mood changes, temper/anger issues, and anxiety/stress.      Denies speech problems, depression, and sleep problems.      Comments: per daughter. pt admits to slight memory problems. Dementia  Past History:  Past Medical History: Last updated: 02/08/2010 Diabetes mellitus, type II (06/2005) Hypertension (1960) Hyperlipidemia (02/2004) Coronary artery disease, Severe Single Vessel Dz (03/2004)  Past Surgical History: Last updated: 08/09/2009 APPY 16YOA HOSP STROKE 2001 L SHOULDER SURG 2004 ECHO EF 55-65% MILD AR 09/15/2003 MRI BRAIN OLD LACUNAR INFARCT  TIGHT L MVERT ART STENOSIS  40% RICA  09/15/2003 ADENOSINE MYOVIEW MILD ISCH EF 72%  TO CATH  03/08/2004 CATH SEVERE SINGLE VESS DZ TX MEDICALLY  04/09/2004 HOSP UNST ANGINA  2/20-2/23/2006 CATH PTCAX3  05/02/2004 HOSP [facility] INSTENT RESTENOSIS  5/10-5/20/2006 CATH EF 65% INSTENT RESTENOSIS  PTCA  07/19/2004 ABD CT INCR IN RETROPERITONEAL  HEMM  07/20/2004 ADENOSINE MYOVIEW MILD ISCH DIST ANT WALL EF 69%  10/17/2004 ADENOSINE MYOVIEW  LOW RISK  09/10/2005 EEG- POS LEFT FRONTAL TEMP FOCUS ADENOSINE CARDIOLITE NO CHANGE NML 09/16/2006 HOSP TIA PNEUMONIA CAROTID STENOSIS HTN  8/28-9/03/2007 HEAD CT OLD L HEMISHERIC STROKE SCATTERED WHITE MATTER DZ NO ACUTE CHANGES  11/06/2007 HEAD MRI NO ACUTE CHANGES  11/07/2007 HEAD MRA STENOSIS DIST R CAROTID <50% RICA 75-80% LICA <50%  11/08/2007 CAROTID DUPLEX BILAT 40-60% ICA STENOSIS L>R   11/09/2007 MODIFIED BARIUM SWALLOW  NO ASPIRATION  11/09/2007 TECHNETIUM  MYOVIEW NML 05/11/2008 HOSP [facility]  CP R/O'D  EPIG PAIN  SHAKING AFTER MORPHINE  8/18-8/19/10 LE U/S NO DVT 10/26/08 HOSP CELLULITIS RLL 5/30-5/31/2011  Social History: 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 patient not competent per daughter. lives at [facility] Physical Exam General appearance: well developed, well nourished, no acute distress Head: normocephalic, atraumatic Eyes: conjunctivae and lids normal Pupils: pupil irregularity after iol Ears: normal, no lesions or deformities Nasal: mucosa pink, nonedematous, no septal deviation, turbinates normal Oral/Pharynx: tongue normal, posterior pharynx without erythema or exudate. dentures Neck: reduced rom w/o pain  trachea midline, no masses Chest/Lungs: no rales, wheezes, or rhonchi bilateral, breath sounds equal without effort Heart: regular rate and  rhythm, Abdomen: soft, non-tender without obvious organomegaly Extremities: normal extremities trace edema Neurological: cranial nerve II-XII grossly intact and non-focal Back: no cvat Skin: no obvious rashes or lesions MSE: disoriented to place, time. confabulates living condition but very pleasant. Assessment New Problems: Sx of EMOTIONAL INSTABILITY (ICD-296.99) URINARY TRACT INFECTION (ICD-599.0)   Patient Education: Patient and/or caregiver instructed in the following: fluids.  Plan New Medications/Changes: CIPROFLOXACIN HCL 250 MG TABS (CIPROFLOXACIN HCL) 1 by mouth bid  #10 x 0, 02/26/2010, J. Juline Patch MD  New Orders: UA Dipstick w/o Micro (automated)  [81003] Urine Culture-[facility] [[id number]]  The patient and/or caregiver has been counseled thoroughly with regard to medications prescribed including dosage, schedule, interactions, rationale for use, and possible side effects and they verbalize understanding.  Diagnoses and expected course of recovery  discussed and will return if not improved as expected or if the condition worsens. Patient and/or caregiver verbalized understanding.  Prescriptions: CIPROFLOXACIN HCL 250 MG TABS (CIPROFLOXACIN HCL) 1 by mouth bid  #10 x 0   Entered and Authorized by:   J. Juline Patch MD   Signed by:   Shela Commons. Juline Patch MD on 02/26/2010   Method used:   Electronically to        [facility] (retail)       [address], [address]       [place], [place]  [address]       Ph: [phone]       Fax: [phone]   RxID:   [id number]   Patient Instructions: 1)  Oral Rehydration Solution: drink 1/2 ounce every 15 minutes. If tolerated afert 1 hour, drink 1 ounce every 15 minutes. As you can tolerate, keep adding 1/2 ounce every 15 minutes, up to a total of 2-4 ounces. Contact the office if unable to tolerate oral solution, if you keep vomiting, or you continue to have signs of dehydration. 2)  urine culture ordered. we may call to change the antibiotic if indicated.  Orders Added: 1)  UA Dipstick w/o Micro (automated)  [  81003] 2)  Urine Culture-[facility] [[id number]]

## 2010-04-12 NOTE — Progress Notes (Signed)
Summary: frustrated, not sleeping  Phone Note Call from Patient Call back at [phone] or [phone]   Caller: [name]- Daughter Call For: Angela Givens MD Summary of Call: Patient has not been sleeping good at night, she has been acting very frustrated and has been getting out of be and walking the halls at night. Daughter is asking if you could call something in for her to help calm her some. Uses [facility] [address]. Please advise.  Initial call taken by: Melody Comas,  April 03, 2010 4:54 PM  Follow-up for Phone Call        I need to see the patient in the clinic.  I need to check her before changing her meds.  If some one can come to the visit that can provide extra information, that would be helpful.  please get me with the patient. Per GSD.  Daughter advised.    Daughter says the that assisted living facility has called to say that they think she needs to be seen in the ER tonight for possible TIA's and she will likely take her there tonight.  If an appt. is still needed here, she will call tomorrow to schedule it.  Lugene Fuquay CMA Joby Hershkowitz Dull)  April 03, 2010 5:34 PM   Additional Follow-up for Phone Call Additional follow up Details #1::        proceed to ER. Angela Givens MD  April 03, 2010 10:29 PM

## 2010-04-12 NOTE — Progress Notes (Signed)
Summary: Order for Vigamox  Phone Note Refill Request Call back at [phone] Message from:  [facility] on March 22, 2009 2:23 PM  Received a faxed order clarification form on Vigamox.  Did not see where patient was using these drop, was not on medication list or previous notes by Dr. Hetty Ely.  [facility] and spoke with the Med Tech and Dr. Hetty Ely did not prescribe the drops, an eye doctor did.  Advised them to call the same doctor's office that prescribed the drops to get orders.

## 2010-04-12 NOTE — Miscellaneous (Signed)
Summary: Diet Order/[facility]  Diet Order/[facility]   Imported By: Lanelle Bal 10/04/2009 11:39:33  _____________________________________________________________________  External Attachment:    Type:   Image     Comment:   External Document

## 2010-04-12 NOTE — Progress Notes (Signed)
Summary: wants order for loperamide  Phone Note From Other Clinic   Caller: Sindy at [facility]  [phone] Summary of Call: Pt is complaining of loose stools and feeling dehydrated.  Family is requesting order for loperamide.  Faxed request from [facility] is on  your shelf.  Dr. Lorenza Chick pt. Initial call taken by: Lowella Petties CMA,  June 01, 2009 8:57 AM  Follow-up for Phone Call        can you please give me a little more backround since I do not know pt? fever/ abd pain/ blood in stool  dizzy/ hypotensive? dehydrated? n/v?  is this chronic? has she had loperimide before? thanks  Follow-up by: Judith Part MD,  June 01, 2009 10:28 AM  Additional Follow-up for Phone Call Additional follow up Details #1::        Spoke with [facility]  and pt has no abdominal pain, no fever,no dizziness, not  hypotensive, and no N&V. This is not a chronic problem for pt. Pt has not had Liperimide before this was what family requested. Pt told daughter she say light red blood in with diarrhea, not sure how much blood. Arline Asp has been encouraging pt to take in more water today and pt is compliant. Pt has no known allergies to medications. Please advise. Lewanda Rife LPN  June 01, 2009 11:41 AM     Additional Follow-up for Phone Call Additional follow up Details #2::    needs f/u-- I do not want to give diarrhea med if ? what source is (can make worse)-esp if there is some blood  Follow-up by: Judith Part MD,  June 01, 2009 12:06 PM  Additional Follow-up for Phone Call Additional follow up Details #3:: Details for Additional Follow-up Action Taken: Arline Asp at [facility] notified as instructed by telephone. Arline Asp said she just checked with pt and pt said loose stool had stopped. Arline Asp will check with pt's daughter before making appt.Lewanda Rife LPN  June 01, 2009 12:30 PM

## 2010-04-12 NOTE — Progress Notes (Signed)
Summary: call a nurse   Phone Note Call from Patient   Call For: Crawford Givens MD Summary of Call: Triage Record Num: [id number] Operator: Kathleen Lime Patient Name: Angela [name] Call Date & Time: 04/03/2010 5:12:51PM Patient Phone: [phone] PCP: Arta Silence Patient Gender: Female PCP Fax : Patient DOB: 02-04-1926 Practice Name: [facility] Reason for Call: Victorino Dike Med tech., Pt left unit without escort today. She has been confused for a few days and has also been agitated and frightened.She has also not been sleeping well. After return to unit her BP was 168/77 pulse 88. Pt will be transported via EMS to [facility]. Protocol(s) Used: Confusion, Disorientation, Agitation Recommended Outcome per Protocol: Activate EMS 911 Reason for Outcome: New or worsening neuro deficits AND new or worsening confusion, disorientation or agitation Care Advice:  ~ IMMEDIATE ACTION 04/03/2010 5:24:21PM Page 1 of 1 [id number] Initial call taken by: Melody Comas,  April 04, 2010 8:13 AM  Follow-up for Phone Call        please call and see if you can get more information.  thanks.  Follow-up by: Crawford Givens MD,  April 04, 2010 10:26 AM  Additional Follow-up for Phone Call Additional follow up Details #1::        Daughter contacted.  Patient had CT done that was negative for any new findings.  Dx. with UTI and dehydration.  Given ABX x 7 days and Haldol.  Appointment scheduled  with you on 04/12/2010 for follow up. (30 minutes) and asked that a caretaker be present to give more information. Additional Follow-up by: Delilah Shan CMA (AAMA),  April 04, 2010 10:52 AM

## 2010-04-12 NOTE — Progress Notes (Signed)
Summary: refill request for amlodipine  Phone Note Refill Request Message from:  Fax from Pharmacy  Refills Requested: Medication #1:  AMLODIPINE BESYLATE 5 MG TABS one tab by mouth at night   Last Refilled: 03/13/2009 [facility].  Initial call taken by: Lowella Petties CMA,  April 13, 2009 12:19 PM  Follow-up for Phone Call        Rx faxed to pharmacy Follow-up by: Sydell Axon LPN,  April 13, 2009 1:55 PM    Prescriptions: AMLODIPINE BESYLATE 5 MG TABS (AMLODIPINE BESYLATE) one tab by mouth at night  #30 x 12   Entered and Authorized by:   Shaune Leeks MD   Signed by:   Shaune Leeks MD on 04/13/2009   Method used:   Printed then faxed to ...         RxID:   [id number]

## 2010-04-12 NOTE — Miscellaneous (Signed)
Summary: [facility] Order/ [facility]  [facility] Order/ [employer] [facility]   Imported By: Lanelle Bal 06/02/2009 11:50:10  _____________________________________________________________________  External Attachment:    Type:   Image     Comment:   External Document

## 2010-04-12 NOTE — Miscellaneous (Signed)
Summary: BS & Med Orders/[facility] & Med Orders/[facility]   Imported By: Lanelle Bal 09/29/2009 09:48:01  _____________________________________________________________________  External Attachment:    Type:   Image     Comment:   External Document

## 2010-04-12 NOTE — Progress Notes (Signed)
Summary: Pt fell last night  Phone Note From Other Clinic   Caller: [facility] [phone] Call For: Dr. Hetty Ely Summary of Call: pt fell last night at 11:00 and not pt is now complaining of right side pain under breast and right hip area. Please advise, there are no appts available. They do not have transportation Wed, can come Thurs or today. Please advise Initial call taken by: Mervin Hack CMA Duncan Dull),  March 14, 2009 9:55 AM  Follow-up for Phone Call        per Dr. Hetty Ely, pt should go to the ER. Spoke with Tammy Sours and advised . DeShannon Smith CMA Duncan Dull)  March 14, 2009 9:58 AM  Agree . Follow-up by: Shaune Leeks MD,  March 14, 2009 10:13 AM

## 2010-04-12 NOTE — Progress Notes (Signed)
Summary: Rx Multivitamins & Crestor  Phone Note Refill Request Call back at [phone] Message from:  [facility]  on April 24, 2009 8:42 AM  Refills Requested: Medication #1:  CRESTOR 10 MG  TABS 1daily by mouth   Last Refilled: 03/27/2009  Medication #2:  MULTIVITAMINS  TABS 1 daily by mouth   Last Refilled: 03/27/2009 Received faxed refill request, please advise.     Method Requested: Fax to Local Pharmacy Initial call taken by: Linde Gillis CMA Duncan Dull),  April 24, 2009 8:44 AM  Follow-up for Phone Call        Rx faxed to pharmacy, [phone] [facility] Follow-up by: Linde Gillis CMA Duncan Dull),  April 24, 2009 10:23 AM    Prescriptions: MULTIVITAMINS  TABS (MULTIPLE VITAMIN) 1 daily by mouth  #100 x 4   Entered and Authorized by:   Shaune Leeks MD   Signed by:   Shaune Leeks MD on 04/24/2009   Method used:   Printed then faxed to ...       [facility] (retail)       [address]       [place], [place]  [address]       Ph: [phone]       Fax: [phone]   RxID:   [id number] CRESTOR 10 MG  TABS (ROSUVASTATIN CALCIUM) 1daily by mouth  #30 x 12   Entered and Authorized by:   Shaune Leeks MD   Signed by:   Shaune Leeks MD on 04/24/2009   Method used:   Printed then faxed to ...       [facility] (retail)       [address]       [place], [place]  [address]       Ph: [phone]       Fax: [phone]   RxID:   [id number]

## 2010-04-12 NOTE — Progress Notes (Signed)
Summary: chest pain  Phone Note From Other Clinic   Caller:  Tammy at [facility] [phone] Summary of Call: Pt was out walking her dog this morning and has had off and on chest pain for 45 mins.  So sob, no nausea, sweating or dizziness.  She does have a hx of MI and stroke.  Tammy is calling to see what you want them to do.   Per Dr Para March advised Tammy to call Dr. Shirlee Latch in cardiology and see what he wants to do, see if he wants the pt seen or go to the ER.  Tammy agreed. Initial call taken by: Lowella Petties CMA,  December 14, 2009 12:16 PM  Follow-up for Phone Call        Please see what the cards clinic would like done, since she is no longer having CP.   Follow-up by: Crawford Givens MD,  December 14, 2009 12:17 PM  Additional Follow-up for Phone Call Additional follow up Details #1::        Tammy will call cardio office for instructions.          Lowella Petties CMA  December 14, 2009 12:35 PM     Additional Follow-up for Phone Call Additional follow up Details #2::    agreed.  Follow-up by: Crawford Givens MD,  December 14, 2009 1:29 PM

## 2010-04-12 NOTE — Miscellaneous (Signed)
   Clinical Lists Changes  Medications: Removed medication of COLCHICINE 0.6 MG TABS (COLCHICINE) one tab by mouth two times a day Removed medication of INDOCIN 50 MG SUPP (INDOMETHACIN) one tab by mouth two times a day, sparingly as needed for gouty symptoms Observations: Added new observation of PAST MED HX: Diabetes mellitus, type II (06/2005) Hypertension (1960) Hyperlipidemia (02/2004) Coronary artery disease, Severe Single Vessel Dz (03/2004)   (02/08/2010 0:13)      Past History:  Past Medical History: Diabetes mellitus, type II (06/2005) Hypertension (1960) Hyperlipidemia (02/2004) Coronary artery disease, Severe Single Vessel Dz (03/2004)   Allergies: 1)  ! Morphine Sulfate (Morphine Sulfate)

## 2010-04-12 NOTE — Progress Notes (Signed)
Summary: call a nurse report  Phone Note From Other Clinic   Caller: Call a nurse Summary of Call: Call a nurse report from this morning- pt went to [employer] without permission, but was back at [employer] and ok.  Initial call taken by: Lowella Petties CMA,  November 20, 2009 5:22 PM  Follow-up for Phone Call        noted.  Follow-up by: Crawford Givens MD,  November 20, 2009 5:29 PM

## 2010-04-12 NOTE — Miscellaneous (Signed)
Summary: Physician's Orders/[facility]  [facility]   Imported By: Maryln Gottron 12/11/2009 13:55:09  _____________________________________________________________________  External Attachment:    Type:   Image     Comment:   External Document

## 2010-04-12 NOTE — Progress Notes (Signed)
Summary: requesting order for UA  Phone Note Other Incoming   Caller: Cindy at [facility]  [phone] Summary of Call: Pt has been acting confused for the past 2 days.  Facility is requesting an order for UA and culture.  Fax is [phone]. Initial call taken by: Lowella Petties CMA,  December 13, 2009 2:30 PM  Follow-up for Phone Call        please fax order for ua and ucx.  dx confusion.  thanks.  Follow-up by: Crawford Givens MD,  December 13, 2009 3:40 PM  Additional Follow-up for Phone Call Additional follow up Details #1::        Faxed. Additional Follow-up by: Delilah Shan CMA (AAMA),  December 13, 2009 5:01 PM

## 2010-04-12 NOTE — Miscellaneous (Signed)
Summary: Lab & UA Order/[facility]  Lab & UA Order/[facility]   Imported By: Lanelle Bal 10/10/2009 10:39:56  _____________________________________________________________________  External Attachment:    Type:   Image     Comment:   External Document

## 2010-04-12 NOTE — Miscellaneous (Signed)
Summary: [facility] -Diet Orders  [facility] -Diet Orders   Imported By: Beau Fanny 09/04/2009 09:44:56  _____________________________________________________________________  External Attachment:    Type:   Image     Comment:   External Document

## 2010-04-12 NOTE — Progress Notes (Signed)
Summary: Home Health evaluation  Phone Note From Other Clinic Call back at [phone]   Caller: Lowella [place] Call For: Dr. Para March Summary of Call: Patient has a bad skin tear.  Needs order for home health nurse to evaluate and treat.   Needs order faxed to [phone]. Initial call taken by: Linde Gillis CMA Duncan Dull),  March 02, 2010 12:10 PM  Follow-up for Phone Call        what form can i fill out for that? Follow-up by: Eustaquio Boyden  MD,  March 02, 2010 1:49 PM  Additional Follow-up for Phone Call Additional follow up Details #1::        There is no form, she just needs a written order like on a Rx that says home health nurse to evaluate and treat.  Linde Gillis CMA Duncan Dull)  March 02, 2010 2:01 PM   filled and handed to [place].  Additional Follow-up by: Eustaquio Boyden  MD,  March 02, 2010 2:02 PM    Additional Follow-up for Phone Call Additional follow up Details #2::    Order faxed to Selena Batten at given fax number. Follow-up by: Linde Gillis CMA Duncan Dull),  March 02, 2010 2:10 PM

## 2010-04-12 NOTE — Miscellaneous (Signed)
Summary: UA & Culture Order/[facility]  UA & Culture Order/[facility]   Imported By: Lanelle Bal 12/21/2009 10:23:23  _____________________________________________________________________  External Attachment:    Type:   Image     Comment:   External Document

## 2010-04-12 NOTE — Progress Notes (Signed)
Summary: zetia,Keppra  Phone Note Refill Request Message from:  Fax from Pharmacy on April 03, 2009 10:51 AM  Refills Requested: Medication #1:  ZETIA 10 MG TABS Take one by mouth daily   Supply Requested: 3 months   Last Refilled: 03/08/2009  Medication #2:  KEPPRA XR 500 MG  TB24 1 tablet twice a day by mouth   Supply Requested: 3 months   Last Refilled: 03/08/2009 [facility] phone [phone] Fax [phone]. Form is in your in box.   Method Requested: Fax to Local Pharmacy Initial call taken by: Benny Lennert CMA Duncan Dull),  April 03, 2009 10:52 AM  Follow-up for Phone Call        Rx faxed to pharmacy Follow-up by: Sydell Axon LPN,  April 03, 2009 3:38 PM    Prescriptions: KEPPRA XR 500 MG  TB24 (LEVETIRACETAM) 1 tablet twice a day by mouth  #60 x 11   Entered and Authorized by:   Shaune Leeks MD   Signed by:   Shaune Leeks MD on 04/03/2009   Method used:   Print then Give to Patient   RxID:   [id number] ZETIA 10 MG TABS (EZETIMIBE) Take one by mouth daily  #30 Tablet x 11   Entered and Authorized by:   Shaune Leeks MD   Signed by:   Shaune Leeks MD on 04/03/2009   Method used:   Print then Give to Patient   RxID:   [id number]

## 2010-04-12 NOTE — Progress Notes (Signed)
Summary: refill request for multi vitamins  Phone Note Refill Request Message from:  Fax from Pharmacy  Refills Requested: Medication #1:  MULTIVITAMINS  TABS 1 daily by mouth   Last Refilled: 09/04/2009 [facility]  Initial call taken by: Lowella Petties CMA,  October 02, 2009 3:20 PM  Follow-up for Phone Call        signed.  Follow-up by: Crawford Givens MD,  October 02, 2009 9:57 PM  Additional Follow-up for Phone Call Additional follow up Details #1::        Faxed Additional Follow-up by: Delilah Shan CMA Rosanne Wohlfarth Dull),  October 03, 2009 2:34 PM    Prescriptions: MULTIVITAMINS  TABS (MULTIPLE VITAMIN) 1 daily by mouth  #100 x 4   Entered by:   Delilah Shan CMA (AAMA)   Authorized by:   Crawford Givens MD   Signed by:   Delilah Shan CMA (AAMA) on 10/03/2009   Method used:   Handwritten   RxID:   [id number]

## 2010-04-12 NOTE — Assessment & Plan Note (Signed)
Summary: 2 M F/U DLO   Vital Signs:  Patient profile:   77 year old female Weight:      161.75 pounds Temp:     98.7 degrees F oral Pulse rate:   60 / minute Pulse rhythm:   regular BP sitting:   130 / 70  (left arm) Cuff size:   regular  Vitals Entered By: Sydell Axon LPN (May 08, 2009 11:54 AM) CC: 2 Month follow-up after labs   History of Present Illness: Pt here with daughter for followup of pain in Gt left toe felt to be gout, put on Colchicine which caused diarrhea and did not help and then put on Indocin with good results. She stopped that when pain resolved. We discussed again today using Indocin sparingly as neeeded to protect kidneys and not increase BP. The episode of toe pain was her first and only. We did a Uric Acid klevel to assess risk. She also was having atypical tinnitus (my theory) which has not changed off the Indocin. This is bearable.  She otherwise is doing well and has no other complaints.   Problems Prior to Update: 1)  Foot Pain, Right  (ICD-729.5) 2)  Gout, Great Toe Right  (ICD-274.9) 3)  Leg Cramps  (ICD-729.82) 4)  Gerd  (ICD-530.81) 5)  Knee Pain, Bilateral  (ICD-719.46) 6)  Chest Pain  (ICD-786.50) 7)  Edema  (ICD-782.3) 8)  Personal Hx Tia & Ci w/o Residual Deficits  (ICD-V12.54) 9)  Ankle Sprain, Left  (ICD-845.00) 10)  Screening Mammogram Nec  (ICD-V76.12) 11)  Diabetes Mellitus, Type II  (ICD-250.00) 12)  Hypercholesterolemia, Mixed  (ICD-272.0) 13)  Hypertension, Benign Essential  (ICD-401.1) 14)  Carotid Artery Stenosis, Bilateral 40% [place] 75-80% 11/06/07  (ICD-433.10) 15)  Cerebrovascular Accident With Right Hemiparesis/subcortical  (ICD-438.20) 16)  Shingles, Hx of  (ICD-V13.8) 17)  Rheumatic Fever, Hx of  (ICD-V12.59) 18)  Tingling/ Arms Bilat  (ICD-782.0) 19)  Symptom, Memory Loss  (ICD-780.93)  Medications Prior to Update: 1)  Zetia 10 Mg Tabs (Ezetimibe) .... Take One By Mouth Daily 2)  Plavix 75 Mg Tabs (Clopidogrel  Bisulfate) .... Take 1 Tablet By Mouth Once A Day 3)  Metoprolol Tartrate 25 Mg  Tabs (Metoprolol Tartrate) .Marland Kitchen.. 1 Tab  By Mouth Two Times A Day 4)  Crestor 10 Mg  Tabs (Rosuvastatin Calcium) .Marland Kitchen.. 1daily By Mouth 5)  Keppra Xr 500 Mg  Tb24 (Levetiracetam) .Marland Kitchen.. 1 Tablet Twice A Day By Mouth 6)  Amlodipine Besylate 5 Mg Tabs (Amlodipine Besylate) .... One Tab By Mouth At Night 7)  Roller Scientist, research (physical sciences) (Misc. Devices) .... Use As Directed Cpt 438.20, 845.00,250.00,433.10 Medical Necessity 8)  Multivitamins  Tabs (Multiple Vitamin) .Marland Kitchen.. 1 Daily By Mouth 9)  Tylenol 325 Mg Tabs (Acetaminophen) .... 2 Tabs By Mouth Every 4 Hrs As Needed For Pain 10)  Lisinopril 40 Mg Tabs (Lisinopril) .... Take One By Mouth Every Day 11)  Vitamin D 1000 Unit Tabs (Cholecalciferol) .... One Tab By Mouth Two Times A Day 12)  Ranitidine Hcl 150 Mg Caps (Ranitidine Hcl) .... One Tab By Mouth Once Daily 13)  Aspirin 81 Mg Tbec (Aspirin) .Marland Kitchen.. 1 Daily By Mouth 14)  Colchicine 0.6 Mg Tabs (Colchicine) .... One Tab By Mouth Two Times A Day 15)  Indocin 50 Mg Supp (Indomethacin) .... One Tab By Mouth Two Times A Day  Allergies: 1)  ! Morphine Sulfate (Morphine Sulfate)  Physical Exam  General:  Well-developed,well-nourished,in no acute distress; alert,appropriate and  cooperative throughout examination Head:  Normocephalic and atraumatic without obvious abnormalities. No apparent alopecia or balding. Sinuses NT. Eyes:  Conjunctiva clear bilaterally.  Ears:  External ear exam shows no significant lesions or deformities.  Otoscopic examination reveals clear canals, tympanic membranes are intact bilaterally without bulging, retraction, inflammation or discharge. Hearing is grossly normal bilaterally. Nose:  External nasal examination shows no deformity or inflammation. Nasal mucosa are pink and moist without lesions or exudates. Mouth:  Oral mucosa and oropharynx without lesions or exudates.  Teeth in good repair. Neck:  No  deformities, masses, or tenderness noted. Lungs:  normal respiratory effort, no intercostal retractions, no accessory muscle use, and normal breath sounds.   Heart:  normal rate, regular rhythm, and no murmur.   Extremities:  L Gt toe nml, NT , no swelling, warmth or erythema.  Diabetes Management Exam:    Foot Exam (with socks and/or shoes not present):       Sensory-Pinprick/Light touch:          Left medial foot (L-4): normal          Left dorsal foot (L-5): normal          Left lateral foot (S-1): normal          Right medial foot (L-4): normal          Right dorsal foot (L-5): normal          Right lateral foot (S-1): normal       Sensory-Monofilament:          Left foot: normal          Right foot: normal       Inspection:          Left foot: normal          Right foot: normal       Nails:          Left foot: normal          Right foot: normal   Impression & Recommendations:  Problem # 1:  FOOT PAIN, RIGHT (ICD-729.5) Assessment Improved Resolved.  Problem # 2:  GOUT, GREAT TOE RIGHT (ICD-274.9) Assessment: Improved  Resolved. Uric Acid high but will follow. Discussed using Indocin as needed sparingly and put on chronic medication only if sxs become much more frequent. Her updated medication list for this problem includes:    Colchicine 0.6 Mg Tabs (Colchicine) ..... One tab by mouth two times a day    Indocin 50 Mg Supp (Indomethacin) ..... One tab by mouth two times a day  Elevate extremity; warm compresses, symptomatic relief and medication as directed.   Problem # 3:  HYPERTENSION, BENIGN ESSENTIAL (ICD-401.1) Assessment: Improved Better with Gout controlled and off Indocin. Cont curr meds. Her updated medication list for this problem includes:    Metoprolol Tartrate 25 Mg Tabs (Metoprolol tartrate) .Marland Kitchen... 1 tab  by mouth two times a day    Amlodipine Besylate 5 Mg Tabs (Amlodipine besylate) ..... One tab by mouth at night    Lisinopril 40 Mg Tabs (Lisinopril)  .Marland Kitchen... Take one by mouth every day  BP today: 130/70 Prior BP: 152/68 (03/07/2009)  Labs Reviewed: K+: 4.3 (05/01/2009) Creat: : 1.2 (05/01/2009)   Chol: 115 (08/09/2008)   HDL: 44.10 (08/09/2008)   LDL: 51 (08/09/2008)   TG: 98.0 (08/09/2008)  Complete Medication List: 1)  Zetia 10 Mg Tabs (Ezetimibe) .... Take one by mouth daily 2)  Plavix 75 Mg Tabs (Clopidogrel bisulfate) .... Take 1  tablet by mouth once a day 3)  Metoprolol Tartrate 25 Mg Tabs (Metoprolol tartrate) .Marland Kitchen.. 1 tab  by mouth two times a day 4)  Crestor 10 Mg Tabs (Rosuvastatin calcium) .Marland Kitchen.. 1daily by mouth 5)  Keppra Xr 500 Mg Tb24 (Levetiracetam) .Marland Kitchen.. 1 tablet twice a day by mouth 6)  Amlodipine Besylate 5 Mg Tabs (Amlodipine besylate) .... One tab by mouth at night 7)  Roller Equities trader (Misc. devices) .... Use as directed cpt 438.20, 845.00,250.00,433.10 medical necessity 8)  Multivitamins Tabs (Multiple vitamin) .Marland Kitchen.. 1 daily by mouth 9)  Tylenol 325 Mg Tabs (Acetaminophen) .... 2 tabs by mouth every 4 hrs as needed for pain 10)  Lisinopril 40 Mg Tabs (Lisinopril) .... Take one by mouth every day 11)  Vitamin D 1000 Unit Tabs (Cholecalciferol) .... One tab by mouth two times a day 12)  Ranitidine Hcl 150 Mg Caps (Ranitidine hcl) .... One tab by mouth once daily 13)  Aspirin 81 Mg Tbec (Aspirin) .Marland Kitchen.. 1 daily by mouth 14)  Colchicine 0.6 Mg Tabs (Colchicine) .... One tab by mouth two times a day 15)  Indocin 50 Mg Supp (Indomethacin) .... One tab by mouth two times a day  Patient Instructions: 1)  RTC next Fall for followup.

## 2010-04-12 NOTE — Letter (Signed)
Summary: Generic Letter  [place] at [facility]  [address] [place], [place] [address]   Phone: [phone]  Fax: [phone]    12/13/2009      Re:   SHIVANI BARRANTES   [address]   [place], [place]  [address] DOB:  [birth year]   TO WHOM IT MAY CONCERN:     Order requested for U/A and culture.    order for ua and ucx.  dx confusion.        Dwana Curd Para March, M.D.  GSD:lsf      Sincerely,   Delilah Shan CMA Duncan Dull)  Appended Document: Generic Letter Letter faxed to [facility] at [phone] on 12/13/09 by Delilah Shan.

## 2010-04-12 NOTE — Letter (Signed)
Summary: Dr.E.Hines,[facility],Note  Dr.E.Hines,[facility],Note   Imported By: Beau Fanny 09/05/2009 15:04:51  _____________________________________________________________________  External Attachment:    Type:   Image     Comment:   External Document

## 2010-04-12 NOTE — Letter (Signed)
Summary: Generic Letter  [place] at [facility]  [address] [place], [place] [address]   Phone: [phone]  Fax: [phone]    10/11/2009    BRUCE MAYERS [address] [place], [place]  [address]    Dear Ms. Athey,  We have been unable to reach you by phone.  If your phone number has changed, please notify our office as it is important that we be able to contact you if necessary.  Your urine culture was negative.  We have been notified that you need  extra imaging for your mammogram.  Please let us know if  this has been set up.  We will be happy to help take care of that if necessary.    Please phone in to [phone], Ext. [phone] to advise.   Thanks.   Sincerely,   Lugene Fuquay CMA (AAMA) for  G. Raechel Ache, M.D.

## 2010-04-12 NOTE — Miscellaneous (Signed)
   Clinical Lists Changes  Observations: Added new observation of MAMMO DUE: 04/2010 (10/19/2009 16:25) Added new observation of MAMMOGRAM: normal (10/18/2009 16:27)      Preventive Care Screening  Mammogram:    Date:  10/18/2009    Next Due:  04/2010    Results:  normal

## 2010-04-12 NOTE — Progress Notes (Signed)
Summary: Vicodin  Phone Note From Other Clinic   Caller: Lauren at [facility]  [phone] Call For: Dr. Hetty Ely Summary of Call: Patient was admitted to [facility] and now is back at [facility].  They have faxed over her new Rx's from the hospital admission.  The family and the POA does not want her to have one of the medications which is Vicodin.  [facility] needs your okay to hold the Vicodin at the family's request. Initial call taken by: Delilah Shan CMA Duncan Dull),  Aug 08, 2009 1:06 PM  Follow-up for Phone Call        Assume the pt not in pain or has alternate medication available. That is fine with me. Follow-up by: Shaune Leeks MD,  Aug 08, 2009 2:03 PM  Additional Follow-up for Phone Call Additional follow up Details #1::        Please fax note to [phone]. Additional Follow-up by: Delilah Shan CMA Duncan Dull),  Aug 08, 2009 2:21 PM

## 2010-04-12 NOTE — Assessment & Plan Note (Signed)
Summary: CPX/RBH   Vital Signs:  Patient profile:   77 year old female Weight:      160.50 pounds Temp:     98.2 degrees F oral Pulse rate:   72 / minute Pulse rhythm:   regular BP sitting:   140 / 62  (left arm) Cuff size:   regular  Vitals Entered By: Sydell Axon LPN (August 31, 2009 9:20 AM) CC: 30 Minute checkup, right arm pain   History of Present Illness: Pt her effor followup. She has ahd a bad dogbite which got infected and required Hosp overnight for IV Abs and then course of Augmentin.  Pt went across the street of the NH to help a 77 year old who was livng alone. She has been told not to wander any more.  Her right arm bothers her in the tricep area. Otherwise she is sleeping ok and eating alright, BMs and urination ok.   Preventive Screening-Counseling & Management  Alcohol-Tobacco     Alcohol drinks/day: 0     Smoking Status: never     Passive Smoke Exposure: no  Caffeine-Diet-Exercise     Caffeine use/day:  4     Does Patient Exercise: yes     Type of exercise: exercises at the NH     Exercise (avg: min/session): <30  Problems Prior to Update: 1)  Gout, Great Toe Right  (ICD-274.9) 2)  Leg Cramps  (ICD-729.82) 3)  Gerd  (ICD-530.81) 4)  Knee Pain, Bilateral  (ICD-719.46) 5)  Chest Pain  (ICD-786.50) 6)  Edema  (ICD-782.3) 7)  Personal Hx Tia & Ci w/o Residual Deficits  (ICD-V12.54) 8)  Ankle Sprain, Left  (ICD-845.00) 9)  Screening Mammogram Nec  (ICD-V76.12) 10)  Diabetes Mellitus, Type II  (ICD-250.00) 11)  Hypercholesterolemia, Mixed  (ICD-272.0) 12)  Hypertension, Benign Essential  (ICD-401.1) 13)  Carotid Artery Stenosis, Bilateral 40% [place] 75-80% 11/06/07  (ICD-433.10) 14)  Cerebrovascular Accident With Right Hemiparesis/subcortical  (ICD-438.20) 15)  Shingles, Hx of  (ICD-V13.8) 16)  Rheumatic Fever, Hx of  (ICD-V12.59) 17)  Tingling/ Arms Bilat  (ICD-782.0) 18)  Symptom, Memory Loss  (ICD-780.93)  Medications Prior to Update: 1)  Zetia 10 Mg  Tabs (Ezetimibe) .... Take One By Mouth Daily 2)  Plavix 75 Mg Tabs (Clopidogrel Bisulfate) .... Take 1 Tablet By Mouth Once A Day 3)  Metoprolol Tartrate 25 Mg  Tabs (Metoprolol Tartrate) .Marland Kitchen.. 1 Tab  By Mouth Two Times A Day 4)  Crestor 10 Mg  Tabs (Rosuvastatin Calcium) .Marland Kitchen.. 1daily By Mouth 5)  Keppra Xr 500 Mg  Tb24 (Levetiracetam) .Marland Kitchen.. 1 Tablet Twice A Day By Mouth 6)  Amlodipine Besylate 5 Mg Tabs (Amlodipine Besylate) .... One Tab By Mouth At Night 7)  Roller Scientist, research (physical sciences) (Misc. Devices) .... Use As Directed Cpt 438.20, 845.00,250.00,433.10 Medical Necessity 8)  Multivitamins  Tabs (Multiple Vitamin) .Marland Kitchen.. 1 Daily By Mouth 9)  Tylenol 325 Mg Tabs (Acetaminophen) .... 2 Tabs By Mouth Every 4 Hrs As Needed For Pain 10)  Lisinopril 40 Mg Tabs (Lisinopril) .... Take One By Mouth Every Day 11)  Vitamin D 1000 Unit Tabs (Cholecalciferol) .... One Tab By Mouth Two Times A Day 12)  Ranitidine Hcl 150 Mg Caps (Ranitidine Hcl) .... One Tab By Mouth Once Daily 13)  Aspirin 81 Mg Tbec (Aspirin) .Marland Kitchen.. 1 Daily By Mouth 14)  Colchicine 0.6 Mg Tabs (Colchicine) .... One Tab By Mouth Two Times A Day 15)  Indocin 50 Mg Supp (Indomethacin) .... One  Tab By Mouth Two Times A Day  Allergies: 1)  ! Morphine Sulfate (Morphine Sulfate)  Past History:  Past Medical History: Last updated: 09/10/2006 Diabetes mellitus, type II (06/2005) Hypertension (1960) Hyperlipidemia (02/2004) Coronary artery disease, Severe Single Vessel Dz (03/2004) Diabetes mellitus, type II  Past Surgical History: Last updated: 08/09/2009 APPY 16YOA HOSP STROKE 2001 L SHOULDER SURG 2004 ECHO EF 55-65% MILD AR 09/15/2003 MRI BRAIN OLD LACUNAR INFARCT  TIGHT L MVERT ART STENOSIS  40% RICA  09/15/2003 ADENOSINE MYOVIEW MILD ISCH EF 72%  TO CATH  03/08/2004 CATH SEVERE SINGLE VESS DZ TX MEDICALLY  04/09/2004 HOSP UNST ANGINA  2/20-2/23/2006 CATH PTCAX3  05/02/2004 HOSP [facility] INSTENT RESTENOSIS  5/10-5/20/2006 CATH EF 65% INSTENT  RESTENOSIS  PTCA  07/19/2004 ABD CT INCR IN RETROPERITONEAL HEMM  07/20/2004 ADENOSINE MYOVIEW MILD ISCH DIST ANT WALL EF 69%  10/17/2004 ADENOSINE MYOVIEW  LOW RISK  09/10/2005 EEG- POS LEFT FRONTAL TEMP FOCUS ADENOSINE CARDIOLITE NO CHANGE NML 09/16/2006 HOSP TIA PNEUMONIA CAROTID STENOSIS HTN  8/28-9/03/2007 HEAD CT OLD L HEMISHERIC STROKE SCATTERED WHITE MATTER DZ NO ACUTE CHANGES  11/06/2007 HEAD MRI NO ACUTE CHANGES  11/07/2007 HEAD MRA STENOSIS DIST R CAROTID <50% RICA 75-80% LICA <50%  11/08/2007 CAROTID DUPLEX BILAT 40-60% ICA STENOSIS L>R   11/09/2007 MODIFIED BARIUM SWALLOW  NO ASPIRATION  11/09/2007 TECHNETIUM  MYOVIEW NML 05/11/2008 HOSP [facility]  CP R/O'D  EPIG PAIN  SHAKING AFTER MORPHINE 8/18-8/19/10 LE U/S NO DVT 10/26/08 HOSP CELLULITIS RLL 5/30-5/31/2011  Family History: Last updated: 08/31/2009 FATHER DEC 65 CIRRHOSIS ETOH MOTHER DEC 89 HEART ANEURYSM BROTHER DEC MI MULTIP BROTHER A  78  (GERRY)  ETOH  BROTHER A 90 (SONNY)  MI X 2    ETOH SISTER A 87 CAD Learning Disability SISTER DEC IN FIRE SISTER DEC EMPHYSEMA SMOKER  Social History: Last updated: 07/15/2006 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 Married Divorced remarried widow 10/06/83 Ca  lives alone  4 daughters Never Smoked Alcohol use-no Drug use-no  Risk Factors: Alcohol Use: 0 (08/31/2009) Caffeine Use:  4 (08/31/2009) Exercise: yes (08/31/2009)  Risk Factors: Smoking Status: never (08/31/2009) Passive Smoke Exposure: no (08/31/2009)  Family History: FATHER DEC 65 CIRRHOSIS ETOH MOTHER DEC 89 HEART ANEURYSM BROTHER DEC MI MULTIP BROTHER A  78  (GERRY)  ETOH  BROTHER A 90 (SONNY)  MI X 2    ETOH SISTER A 87 CAD Learning Disability SISTER DEC IN FIRE SISTER DEC EMPHYSEMA SMOKER  Social History: Caffeine use/day:   4 Does Patient Exercise:  yes  Review of Systems General:  Complains of weakness; denies chills, fatigue, fever, sweats, and weight loss; occas. Eyes:   Complains of blurring; denies discharge and eye pain; macular degen i right eye, appt tomm . ENT:  Complains of decreased hearing; denies ear discharge and earache. CV:  Complains of chest pain or discomfort, swelling of feet, and swelling of hands; denies fainting, fatigue, palpitations, and shortness of breath with exertion; occas. Resp:  Denies cough, shortness of breath, and wheezing. GI:  Denies abdominal pain, bloody stools, change in bowel habits, constipation, dark tarry stools, diarrhea, indigestion, loss of appetite, nausea, vomiting, vomiting blood, and yellowish skin color. GU:  Denies discharge, dysuria, nocturia, and urinary frequency. MS:  Complains of muscle aches and cramps; right upper arm muscle pain, cramping of legs.. Derm:  had cellulitis, rash/echymosis from Plavix. Neuro:  Complains of poor balance; denies numbness, tingling, and tremors.  Physical Exam  General:  Well-developed,well-nourished,in no acute distress; alert,appropriate and cooperative throughout  examination Head:  Normocephalic and atraumatic without obvious abnormalities. No apparent alopecia or balding. Sinuses NT. Eyes:  Conjunctiva clear bilaterally.  Ears:  External ear exam shows no significant lesions or deformities.  Otoscopic examination reveals clear canals, tympanic membranes are intact bilaterally without bulging, retraction, inflammation or discharge. Hearing is grossly normal bilaterally. Nose:  External nasal examination shows no deformity or inflammation. Nasal mucosa are pink and moist without lesions or exudates. Mouth:  Oral mucosa and oropharynx without lesions or exudates.  Teeth in good repair. Neck:  No deformities, masses, or tenderness noted. Chest Wall:  No deformities, masses, or tenderness noted. No tenderness to A/P or lat wall direct pressure. Breasts:  Not done at pt request, will get Mammo. Lungs:  normal respiratory effort, no intercostal retractions, no accessory muscle  use, and normal breath sounds.   Heart:  normal rate, regular rhythm, and no murmur.   Abdomen:  Bowel sounds positive,abdomen soft and non-tender without masses, organomegaly or hernias noted. Rectal:  not done Genitalia:  not done Msk:  no pain/mass behind either knee with palp, off and on exam table with minimal assistance. Pulses:  R dorsalis pedis normal, L posterior tibial normal, L dorsalis pedis normal, and R posterior tibial decreased.   Extremities:  L Gt toe nml, NT , no swelling, warmth or erythema. R upper arm tender in the Tricep distribution but seems to go to the bone, no swelling of defect felt. Neurologic:  uses hands to push to stance, waits for several seconds before slow plodding gait, occ holds to wall, tries not to use right arm. alert, gets confused when relating story Skin:  turgor normal and color normal.  Signif echymosis of the forearms bilat. Healing eschar of right mid anterior shin. Cervical Nodes:  No lymphadenopathy noted Inguinal Nodes:  No significant adenopathy Psych:  Cognition and judgment appear intact. Alert and cooperative with normal attention span and concentration. No apparent delusions, illusions, hallucinations, memory off slightly but still able to discuss situations.  Diabetes Management Exam:    Foot Exam (with socks and/or shoes not present):       Sensory-Pinprick/Light touch:          Left medial foot (L-4): diminished          Left dorsal foot (L-5): diminished          Left lateral foot (S-1): diminished          Right medial foot (L-4): diminished          Right dorsal foot (L-5): diminished          Right lateral foot (S-1): diminished       Sensory-Monofilament:          Left foot: normal          Right foot: normal       Inspection:          Left foot: normal          Right foot: normal       Nails:          Left foot: normal          Right foot: normal   Impression & Recommendations:  Problem # 1:  HEALTH MAINTENANCE  EXAM (ICD-V70.0) Discussed diet and avoiding salt, needs low salt diet which was discussed with the daughter to request at the home. Discussed continuing exercise for balance and use of cane or walker which she refuses, her daughrter says due to vanity! They  will call her insurance to check on Zostavax coverage, but suggest she get it even if insurance won't pay. She has had shingles twice already.  Problem # 2:  ARM PAIN, RIGHT UPPER (ICD-729.5) Assessment: New Has been going on for a few weeks. Will have her evaluated at ortho, appears muscular, r/o bony disease. Orders: Orthopedic Referral (Ortho)  Problem # 3:  GOUT, GREAT TOE RIGHT (ICD-274.9) Assessment: Improved  Resolved but at risk due to gout. Her updated medication list for this problem includes:    Colchicine 0.6 Mg Tabs (Colchicine) ..... One tab by mouth two times a day    Indocin 50 Mg Supp (Indomethacin) ..... One tab by mouth two times a day, sparingly as needed for gouty symptoms  Problem # 4:  LEG CRAMPS (ICD-729.82) Assessment: Unchanged Try using jogging in a jug regularly. Metabolites are fine.  Problem # 5:  GERD (ICD-530.81) Assessment: Unchanged  Stable. No recent complaints. Her updated medication list for this problem includes:    Ranitidine Hcl 150 Mg Caps (Ranitidine hcl) ..... One tab by mouth once daily  Diagnostics Reviewed:  Discussed lifestyle modifications, diet, antacids/medications, and preventive measures. Handout provided.   Problem # 6:  EDEMA (ICD-782.3) Assessment: Unchanged Salt restriction and elevation should be acceptable.  Problem # 7:  DIABETES MELLITUS, TYPE II (ICD-250.00) Assessment: Unchanged Good contriol, microalb elevated. Her updated medication list for this problem includes:    Lisinopril 40 Mg Tabs (Lisinopril) .Marland Kitchen... Take one by mouth every day    Aspirin 81 Mg Tbec (Aspirin) .Marland Kitchen... 1 daily by mouth  Labs Reviewed: Creat: 1.1 (08/28/2009)     Last Eye Exam: normal  (01/25/2009) Reviewed HgBA1c results: 6.3 (08/28/2009)  6.2 (02/09/2009)  Problem # 8:  HYPERTENSION, BENIGN ESSENTIAL (ICD-401.1) Assessment: Unchanged Will follow, salt restriction will normalize if done. Recheck next time and medicate as needed. Her updated medication list for this problem includes:    Metoprolol Tartrate 25 Mg Tabs (Metoprolol tartrate) .Marland Kitchen... 1 tab  by mouth two times a day    Amlodipine Besylate 5 Mg Tabs (Amlodipine besylate) ..... One tab by mouth at night    Lisinopril 40 Mg Tabs (Lisinopril) .Marland Kitchen... Take one by mouth every day  BP today: 140/62 Prior BP: 130/70 (05/08/2009)  Labs Reviewed: K+: 4.6 (08/28/2009) Creat: : 1.1 (08/28/2009)   Chol: 116 (08/28/2009)   HDL: 43.70 (08/28/2009)   LDL: 52 (08/28/2009)   TG: 103.0 (08/28/2009)  Complete Medication List: 1)  Zetia 10 Mg Tabs (Ezetimibe) .... Take one by mouth daily 2)  Plavix 75 Mg Tabs (Clopidogrel bisulfate) .... Take 1 tablet by mouth once a day 3)  Metoprolol Tartrate 25 Mg Tabs (Metoprolol tartrate) .Marland Kitchen.. 1 tab  by mouth two times a day 4)  Crestor 10 Mg Tabs (Rosuvastatin calcium) .Marland Kitchen.. 1daily by mouth 5)  Keppra Xr 500 Mg Tb24 (Levetiracetam) .Marland Kitchen.. 1 tablet twice a day by mouth 6)  Amlodipine Besylate 5 Mg Tabs (Amlodipine besylate) .... One tab by mouth at night 7)  Roller Equities trader (Misc. devices) .... Use as directed cpt 438.20, 845.00,250.00,433.10 medical necessity 8)  Multivitamins Tabs (Multiple vitamin) .Marland Kitchen.. 1 daily by mouth 9)  Tylenol 325 Mg Tabs (Acetaminophen) .... 2 tabs by mouth every 4 hrs as needed for pain 10)  Lisinopril 40 Mg Tabs (Lisinopril) .... Take one by mouth every day 11)  Vitamin D 1000 Unit Tabs (Cholecalciferol) .... One tab by mouth two times a day 12)  Ranitidine Hcl 150 Mg Caps (Ranitidine  hcl) .... One tab by mouth once daily 13)  Aspirin 81 Mg Tbec (Aspirin) .Marland Kitchen.. 1 daily by mouth 14)  Colchicine 0.6 Mg Tabs (Colchicine) .... One tab by mouth two times a day 15)   Indocin 50 Mg Supp (Indomethacin) .... One tab by mouth two times a day, sparingly as needed for gouty symptoms  Other Orders: Radiology Referral (Radiology)  Patient Instructions: 1)  Please refer to [facility] for assessment and trmt of right upper arm pain for a few weeks. 2)  Refer for Mammo  Current Allergies (reviewed today): ! MORPHINE SULFATE (MORPHINE SULFATE)

## 2010-04-12 NOTE — Progress Notes (Signed)
Summary: ? UTi  Phone Note Call from Patient   Caller: Daughter Summary of Call: Pt's daughter reports that she thinks  pt has a UTI and is asking to be seen.  I advised that there are no appts available and suggested that pt go to [facility]. Initial call taken by: Lowella Petties CMA, AAMA,  February 26, 2010 9:57 AM  Follow-up for Phone Call        see if you can get her in my 2:45.  Follow-up by: Crawford Givens MD,  February 26, 2010 10:02 AM  Additional Follow-up for Phone Call Additional follow up Details #1::        Daughter says that the other daughter will be taking her to the [facility] because the daughter works second shift and cannot do the 2:45 p.m. appt. today. Additional Follow-up by: Delilah Shan CMA (AAMA),  February 26, 2010 11:06 AM

## 2010-04-12 NOTE — Progress Notes (Signed)
Summary: call a nurse  Phone Note Call from Patient   Summary of Call: Triage Record Num: [id number] Operator: Coralee [place] Patient Name: Angela [name] Call Date & Time: 08/19/2009 3:36:15PM Patient Phone: [phone] PCP: Arta Silence Patient Gender: Female PCP Fax : Patient DOB: 04/25/1925 Practice Name: [facility] Reason for Call: Rosanne Gutting with [facility] caling about pt sitting on the front porch and went across the street to help the neighbor without signing herself out. Advised to place on [facility]. MT advises nurse would not want to persue that due to having a pet and taking it outside. MT advises she normally stays within approved parameters. Pt does have Dementia. Family notified of incident. No injuries. Protocol(s) Used: Office Note Recommended Outcome per Protocol: Information Noted and Sent to Office Reason for Outcome: Caller information to office Care Advice:  ~ 06/ Initial call taken by: Melody Comas,  August 21, 2009 9:02 AM  Follow-up for Phone Call        Noted. Follow-up by: Shaune Leeks MD,  August 21, 2009 9:27 AM

## 2010-04-12 NOTE — Letter (Signed)
Summary: Generic Letter  [place] [facility]  [address] [place], [place] [address]   Phone: [phone]  Fax: [phone]    02/20/2010    Re:   Angela [name]   [address]   [place], [place]  [address] DOB:   [birth year]    Med tech at facility states there is a GI bug going around and he is asking if pt can have as needed orders is she starts coming down with symptoms.  Most of the residents with symptoms are having nausea, vomiting and diarrhea.  Please give: Zofran 4 mg.  by mouth three times a day as needed for nausea and vomiting.       Tylenol 325mg , 2 by mouth three times a day as needed for temp >100.4.    If signs/sx of dehydration, then follow up at clinic.    thanks.      Dwana Curd Para March, M.D.  [facility]

## 2010-04-12 NOTE — Progress Notes (Signed)
  Phone Note Other Incoming   Caller: [name]  (daughter, [place]) [phone] Summary of Call: Call taken by staff with EMR was down yesterday.  I tried to call her back that afternoon but had to leave a message.  I tried to call her back today and again left a message.  She's out of the office until Monday.  I will await return call from her.  Initial call taken by: Crawford Givens MD,  October 27, 2009 8:49 AM  Follow-up for Phone Call        I talked to the daughter this AM at the home number.  She had questions about the mammogram.  It had likely benign findings and needs 6 month follow up.  She understood.   Follow up as needed.  Given her dementia, she may end up declining the follow up mammogram anyway.  I d/w the daughter [id number] and she understood.

## 2010-04-12 NOTE — Letter (Signed)
Summary: Results Follow up Letter  [place] [facility]  [address] [place], [place] [address]   Phone: [phone]  Fax: [phone]    10/19/2009 MRN: [id number]    Angela [name] [address] [place], [place]  [address]    Dear Ms. Radabaugh,  The following are the results of your recent test(s):  Test         Result    Pap Smear:        Normal _____  Not Normal _____ Comments: ______________________________________________________ Cholesterol: LDL(Bad cholesterol):         Your goal is less than:         HDL (Good cholesterol):       Your goal is more than: Comments:  ______________________________________________________ Mammogram:        Normal __X___  Not Normal _____ Comments:   Recommends follow up in 6 months.  Please let us know if we need to get that appointment scheduled.  It should be in February 2012.  ___________________________________________________________________ Hemoccult:        Normal _____  Not normal _______ Comments:    _____________________________________________________________________ Other Tests:    We routinely do not discuss normal results over the telephone.  If you desire a copy of the results, or you have any questions about this information we can discuss them at your next office visit.   Sincerely,   Dwana Curd. Para March, M.D.  [facility]

## 2010-04-12 NOTE — Progress Notes (Signed)
Summary: Rx Plavix  Phone Note Refill Request Call back at [phone] Message from:  [facility] on April 10, 2009 4:43 PM  Refills Requested: Medication #1:  PLAVIX 75 MG TABS Take 1 tablet by mouth once a day   Last Refilled: 03/11/2009 Form is in your in box   Method Requested: Fax to Mail Away Pharmacy Initial call taken by: Sydell Axon LPN,  April 10, 2009 4:44 PM  Follow-up for Phone Call        Rx faxed to pharmacy Follow-up by: Sydell Axon LPN,  April 10, 2009 5:55 PM    Prescriptions: PLAVIX 75 MG TABS (CLOPIDOGREL BISULFATE) Take 1 tablet by mouth once a day  #30 Tablet x 12   Entered and Authorized by:   Shaune Leeks MD   Signed by:   Shaune Leeks MD on 04/10/2009   Method used:   Telephoned to ...         RxID:   [id number]

## 2010-04-12 NOTE — Progress Notes (Signed)
Summary: Rx Vitamin D and Lisinopril  Phone Note Refill Request Call back at [phone] Message from:  [facility] on April 24, 2009 12:02 PM  Refills Requested: Medication #1:  VITAMIN D 1000 UNIT TABS one tab by mouth two times a day   Last Refilled: 03/27/2009  Medication #2:  LISINOPRIL 40 MG TABS take one by mouth every day   Last Refilled: 03/27/2009 Received faxed refill request, forms in your IN box.     Method Requested: Fax to Mail Away Pharmacy Initial call taken by: Linde Gillis CMA Duncan Dull),  April 24, 2009 12:03 PM  Follow-up for Phone Call        Rx faxed to pharmacy Follow-up by: Sydell Axon LPN,  April 24, 2009 3:15 PM    Prescriptions: LISINOPRIL 40 MG TABS (LISINOPRIL) take one by mouth every day  #30 x 12   Entered and Authorized by:   Shaune Leeks MD   Signed by:   Shaune Leeks MD on 04/24/2009   Method used:   Printed then faxed to ...         RxID:   [id number] VITAMIN D 1000 UNIT TABS (CHOLECALCIFEROL) one tab by mouth two times a day  #60 x 12   Entered and Authorized by:   Shaune Leeks MD   Signed by:   Shaune Leeks MD on 04/24/2009   Method used:   Printed then faxed to ...         RxID:   [id number]

## 2010-04-12 NOTE — Miscellaneous (Signed)
Summary: [facility] Physician Order  [facility] Physician Order   Imported By: Beau Fanny 04/05/2009 10:22:27  _____________________________________________________________________  External Attachment:    Type:   Image     Comment:   External Document

## 2010-04-12 NOTE — Progress Notes (Signed)
Summary: clarification of med  Phone Note From Other Clinic   Caller: [facility] Call For: Dr. Hetty Ely Summary of Call: On 11/1 pt had a prescription for Vigamox 1 drop in right eye four times daily for 4 days after each injection or infection with as needed refills. Please clarify whether or not this is an ongoing order and how the staff will know to use it. Form for response is on shelf. Initial call taken by: Silas Sacramento CMA,  April 17, 2009 9:43 AM  Follow-up for Phone Call        I didn't prescriber this. This is an eye doctor prescribed medication, thus the name Dr Ashley Royalty crosed out. It is an Ab and prob not needed. Follow-up by: Shaune Leeks MD,  April 17, 2009 1:51 PM  Additional Follow-up for Phone Call Additional follow up Details #1::        Completed form faxed back to [facility]. Additional Follow-up by: Sydell Axon LPN,  April 17, 2009 2:51 PM     Appended Document: clarification of med Form faxed [facility] to [facility] advising that Dr.Tametra Ahart didnt prescribe this.

## 2010-04-12 NOTE — Progress Notes (Signed)
Summary: Order for additional views and ? [place]  Phone Note Other Incoming   Caller: Shirlee Limerick Summary of Call: Form is on your desk in the in box.  Patient needs an order for the additional views and possible ultrasound with the diagnosis of "density".  This has already been done but the order has to be sent in. Initial call taken by: Delilah Shan CMA De[place]rich Steve Dull),  October 24, 2009 12:07 PM  Follow-up for Phone Call        Please send the form back.  thanks.  Follow-up by: Crawford Givens MD,  October 24, 2009 1:30 PM  Additional Follow-up for Phone Call Additional follow up Details #1::        Faxed. Additional Follow-up by: Delilah Shan CMA (AAMA),  October 24, 2009 1:52 PM

## 2010-04-12 NOTE — Progress Notes (Signed)
Summary: wants orders for GI symptoms  Phone Note From Other Clinic   Caller: Tammy Sours at [facility]  [phone] Summary of Call: Med tech at facility states there is a GI bug going around and he is asking if pt can have as needed orders is she starts coming down with symptoms.  Most of the residents with symptoms are having nausea, vomiting and diarrhea.  They are asking for orders for nausea, vomiting and fever.  Fax number is [phone] Initial call taken by: Lowella Petties CMA, AAMA,  February 20, 2010 10:01 AM  Follow-up for Phone Call        please give zofran 4mg  by mouth three times a day as needed for nausea and vomiting.  tylenol 325mg , 2 by mouth three times a day as needed for temp >100.4.  If signs/sx of dehydration, then follow up at clinic.  thanks.  Follow-up by: Crawford Givens MD,  February 20, 2010 10:49 AM  Additional Follow-up for Phone Call Additional follow up Details #1::        Faxed information. Additional Follow-up by: Delilah Shan CMA Gitel Beste Dull),  February 20, 2010 10:56 AM

## 2010-04-12 NOTE — Miscellaneous (Signed)
Summary: [facility] Orders   Imported By: Beau Fanny 05/12/2009 10:18:53  _____________________________________________________________________  External Attachment:    Type:   Image     Comment:   External Document

## 2010-04-12 NOTE — Miscellaneous (Signed)
Summary: [facility] Orders   Imported By: Beau Fanny 08/09/2009 10:47:45  _____________________________________________________________________  External Attachment:    Type:   Image     Comment:   External Document

## 2010-04-12 NOTE — Miscellaneous (Signed)
Summary: [facility]-Order to cleanse skin tear on right lower leg  [facility]-Order to cleanse skin tear on right lower leg   Imported By: Beau Fanny 07/27/2009 15:19:12  _____________________________________________________________________  External Attachment:    Type:   Image     Comment:   External Document

## 2010-04-13 NOTE — Miscellaneous (Signed)
Summary: [facility]  [employer]   Imported By: Lester Shickshinny 02/15/2010 11:07:34  _____________________________________________________________________  External Attachment:    Type:   Image     Comment:   External Document

## 2010-04-13 NOTE — Miscellaneous (Signed)
Summary: [facility] Orders/[facility]  [facility] Orders/[facility]   Imported By: Sherian Rein 02/13/2010 09:21:50  _____________________________________________________________________  External Attachment:    Type:   Image     Comment:   External Document

## 2010-04-18 NOTE — Assessment & Plan Note (Signed)
Summary: disscuss meds/alc   Vital Signs:  Patient profile:   77 year old female Height:      63.0 inches Weight:      169.75 pounds BMI:     30.18 Temp:     97.7 degrees F oral Pulse rate:   88 / minute Pulse rhythm:   regular BP sitting:   140 / 66  (left arm) Cuff size:   large  Vitals Entered By: Delilah Shan CMA (AAMA) (April 09, 2010 10:12 AM) CC: Discuss medications   History of Present Illness: Prev seen at ER with UTI.  Her memory has gotten worse even before the UTI.  Not as belligerent now.  Not sleeping.  Walking the halls. She had been wandering and now has location bracelet.  Unclear if the haldol made a sig change; she only had one dose.  H/o memory changes likely related to prev CVA.   R shoulder pain.  Prev fall.  H/o R frozen shoulder.  Pain with movement, diffuse and not focal.  Local brusing noted.  I talked with daughter about rearranging furniture to decrease chance of fall.    Here with daughter today.  Another daughter on the speaker phone during the OV.     Allergies: 1)  ! Morphine Sulfate (Morphine Sulfate)  Past History:  Past Surgical History: Last updated: 08/09/2009 APPY 16YOA HOSP STROKE 2001 L SHOULDER SURG 2004 ECHO EF 55-65% MILD AR 09/15/2003 MRI BRAIN OLD LACUNAR INFARCT  TIGHT L MVERT ART STENOSIS  40% RICA  09/15/2003 ADENOSINE MYOVIEW MILD ISCH EF 72%  TO CATH  03/08/2004 CATH SEVERE SINGLE VESS DZ TX MEDICALLY  04/09/2004 HOSP UNST ANGINA  2/20-2/23/2006 CATH PTCAX3  05/02/2004 HOSP [facility] INSTENT RESTENOSIS  5/10-5/20/2006 CATH EF 65% INSTENT RESTENOSIS  PTCA  07/19/2004 ABD CT INCR IN RETROPERITONEAL HEMM  07/20/2004 ADENOSINE MYOVIEW MILD ISCH DIST ANT WALL EF 69%  10/17/2004 ADENOSINE MYOVIEW  LOW RISK  09/10/2005 EEG- POS LEFT FRONTAL TEMP FOCUS ADENOSINE CARDIOLITE NO CHANGE NML 09/16/2006 HOSP TIA PNEUMONIA CAROTID STENOSIS HTN  8/28-9/03/2007 HEAD CT OLD L HEMISHERIC STROKE SCATTERED WHITE MATTER DZ NO ACUTE CHANGES   11/06/2007 HEAD MRI NO ACUTE CHANGES  11/07/2007 HEAD MRA STENOSIS DIST R CAROTID <50% RICA 75-80% LICA <50%  11/08/2007 CAROTID DUPLEX BILAT 40-60% ICA STENOSIS L>R   11/09/2007 MODIFIED BARIUM SWALLOW  NO ASPIRATION  11/09/2007 TECHNETIUM  MYOVIEW NML 05/11/2008 HOSP [facility]  CP R/O'D  EPIG PAIN  SHAKING AFTER MORPHINE 8/18-8/19/10 LE U/S NO DVT 10/26/08 HOSP CELLULITIS RLL 5/30-5/31/2011  Past Medical History: Diabetes mellitus, type II (06/2005) Hypertension (1960) Hyperlipidemia (02/2004) Coronary artery disease, Severe Single Vessel Dz (03/2004) Memory Loss H/o CVA  Social History: Reviewed history from 02/26/2010 and no changes required. 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 patient not competent per daughter. lives at [facility]  Review of Systems       See HPI.  Otherwise negative.  denies abdominal pain or dysuria currently.   Physical Exam  General:  Alert, pleasant. Notable findings on memory testing: Not oriented to year, + orinented to season.  Distant memory intact.   Aware that she is In MD office.  No idea of the floor.  0/3 ball flag tree recall.   DLROW (2/5) 3/3 following commands EOMI mucous membranes moist regular rate and rhythm clear to auscultation bilaterally R shoulder diffusely  tender to palpation and with likely old finding of decrease in range of motion, but no focal/isolated tenderness.  bruising noted on lateral prox arm. ext with 1+ ankle edema gait is symmetric with walker speech fluent   Impression & Recommendations:  Problem # 1:  SYMPTOM, MEMORY LOSS (ICD-780.93) >25 min spent with patient and family, at least half of which was spent on counseling on plan.  Haldol for behavior changes is off label and there is a mortality risk assocaited with it.  give  the memory loss that predates the UTI, I would try aricept and see if this made any change for behavioral symptoms or memory.  If she has no other alternatives and this doesn't help, he may have to discuss the options of haldol use in the future.  I talked with them about acute changes due to UTI.  They understood.    Problem # 2:  ARM PAIN, RIGHT UPPER (ICD-729.5) No fx seen.  I would use tylenol as needed and then call back as needed.  We talked about fall precautions.  Orders: T-Shoulder Right (73030TC) T-Humerus Right (73060TC)  Complete Medication List: 1)  Zetia 10 Mg Tabs (Ezetimibe) .... Take one by mouth daily 2)  Plavix 75 Mg Tabs (Clopidogrel bisulfate) .... Take 1 tablet by mouth once a day 3)  Metoprolol Tartrate 25 Mg Tabs (Metoprolol tartrate) .Marland Kitchen.. 1 tab  by mouth two times a day 4)  Crestor 10 Mg Tabs (Rosuvastatin calcium) .Marland Kitchen.. 1daily by mouth 5)  Keppra Xr 500 Mg Tb24 (Levetiracetam) .Marland Kitchen.. 1 tablet twice a day by mouth 6)  Amlodipine Besylate 5 Mg Tabs (Amlodipine besylate) .... One tab by mouth at night 7)  Roller Equities trader (Misc. devices) .... Use as directed cpt 438.20, 845.00,250.00,433.10 medical necessity 8)  Multivitamins Tabs (Multiple vitamin) .Marland Kitchen.. 1 daily by mouth 9)  Tylenol 325 Mg Tabs (Acetaminophen) .... 2 tabs by mouth every 4 hrs as needed for pain 10)  Lisinopril 40 Mg Tabs (Lisinopril) .... Take one by mouth every day 11)  Vitamin D 1000 Unit Tabs (Cholecalciferol) .... One tab by mouth two times a day 12)  Ranitidine Hcl 150 Mg Caps (Ranitidine hcl) .... One tab by mouth once daily 13)  Aspirin 81 Mg Tbec (Aspirin) .Marland Kitchen.. 1 daily by mouth 14)  Levaquin 750 Mg Tabs (Levofloxacin) .... Take 1 tablet by mouth once a day x 7 days 15)  Colace 100 Mg Caps (Docusate sodium) .... Take 1 capsule by mouth two times a day as needed for constipation 16)  Aricept 5 Mg Tabs (Donepezil hcl) .Marland Kitchen.. 1 by mouth once daily  Patient Instructions: 1)  Start the aricept at 5mg   a day and let me know if you notice any change in symptoms.  I would allow a few weeks to see the full change.  If no relief, we can consider increasing the dose.   2)  Schedule follow up in 1 month.  appointment.  3)  Stop the haldol.  Prescriptions: ARICEPT 5 MG TABS (DONEPEZIL HCL) 1 by mouth once daily  #30 x 1   Entered and Authorized by:   Crawford Givens MD   Signed by:   Crawford Givens MD on 04/09/2010   Method used:   [facility] [id number]       [address]       [place], [place]  [address]       Ph:  [phone] or [phone]       Fax: [phone]   RxID:   [id number]    Orders Added: 1)  Est. Patient Level IV [44010] 2)  T-Shoulder Right [73030TC] 3)  T-Humerus Right [73060TC]    Current Allergies (reviewed today): ! MORPHINE SULFATE (MORPHINE SULFATE)

## 2010-04-18 NOTE — Progress Notes (Signed)
Summary: pt left facility again  Phone Note From Other Clinic   Caller: Lauren at [facility]  [phone] Summary of Call: Pt left the facility for only a few minutes over the weekend, to walk her dog.  Lauren said she has to report this to you because they have had problems with pt leaving the facility in the past. Initial call taken by: Lowella Petties CMA, AAMA,  April 11, 2010 2:32 PM  Follow-up for Phone Call        noted.  no change in orders at this point.  will see response to aricept over the next few weeks.  Follow-up by: Crawford Givens MD,  April 11, 2010 2:34 PM

## 2010-04-26 NOTE — Miscellaneous (Signed)
[facility]   Imported By: Kassie Mends 04/16/2010 11:30:01  _____________________________________________________________________  External Attachment:    Type:   Image     Comment:   External Document

## 2010-04-26 NOTE — Miscellaneous (Signed)
[facility]   Imported By: Kassie Mends 04/16/2010 11:29:16  _____________________________________________________________________  External Attachment:    Type:   Image     Comment:   External Document

## 2010-04-26 NOTE — Miscellaneous (Signed)
[facility]   Imported By: Kassie Mends 04/16/2010 11:31:50  _____________________________________________________________________  External Attachment:    Type:   Image     Comment:   External Document

## 2010-05-10 ENCOUNTER — Encounter: Payer: Self-pay | Admitting: Family Medicine

## 2010-05-10 ENCOUNTER — Ambulatory Visit (INDEPENDENT_AMBULATORY_CARE_PROVIDER_SITE_OTHER): Payer: Medicare Other | Admitting: Family Medicine

## 2010-05-10 DIAGNOSIS — M79609 Pain in unspecified limb: Secondary | ICD-10-CM

## 2010-05-10 DIAGNOSIS — R413 Other amnesia: Secondary | ICD-10-CM

## 2010-05-15 ENCOUNTER — Encounter: Payer: Self-pay | Admitting: Family Medicine

## 2010-05-17 ENCOUNTER — Encounter (INDEPENDENT_AMBULATORY_CARE_PROVIDER_SITE_OTHER): Payer: Self-pay | Admitting: *Deleted

## 2010-05-17 NOTE — Assessment & Plan Note (Signed)
Summary: 30 MIN APPT 1 MONTH FOLLOW UP/[facility]   Vital Signs:  Patient profile:   77 year old female Height:      63.0 inches Weight:      166.75 pounds BMI:     29.65 Temp:     97.9 degrees F oral Pulse rate:   80 / minute Pulse rhythm:   regular BP sitting:   150 / 66  (left arm) Cuff size:   large  Vitals Entered By: Delilah Shan CMA (AAMA) (May 10, 2010 10:30 AM) CC: 30 minute appointment - 1 month follow up   History of Present Illness: R shoulder pain continues.  H/o frozen shoulder on that side.  She isn't moving it much per family.    Was seen at [facility] for confusion before last OV.  She had a CT done and this was without sig change.  She didn't get admitted.  No other ER visits in meantime.  Continues to have trouble with memory and potential for wandering.  H/o mult CVA.  Daughters here wiht patient today.  Was started on aricept w/o much change, but has tolerated the medicine.   No fevers, dysuria. not short of breath.  no other c/o per patient.   Allergies: 1)  ! 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 patient not competent. lives at [facility]  Review of Systems       See HPI.  Otherwise negative.    Physical Exam  General:  Alert, pleasant. Notable findings on memory testing: Not oriented to year, + orinented to month, day.  Distant memory intact.   Aware that she is In MD office.  No idea of the floor.  1/3 ball flag tree recall.   DLROW (5/5) 3/3 following commands EOMI mucous membranes  moist regular rate and rhythm clear to auscultation bilaterally R shoulder diffusely tender to palpation and with old finding of decrease in range of motion, but no focal/isolated tenderness.  no bruising noted.  active range of motion with more pain than with passive range of motion  ext with 1+ ankle edema gait is symmetric with walker speech fluent   Impression & Recommendations:  Problem # 1:  ARM PAIN, RIGHT UPPER (ICD-729.5) D/w pt and family pendulum exercises for likely partially frozen shoulder.  She needs to move this as much as possible and family will need to remind patient about this.  They understand.   Problem # 2:  SYMPTOM, MEMORY LOSS (ICD-780.93) Will increase the aricept and recheck in 1 month.  >25 min spent with patient, at least half of which was spent on counseling ZO:XWRU.  I told her daughter that her memory would likely worsen.  She is at risk for aspiration, wandering, etc.  She will likely progress to needing full memory care unit if given enough time.  I advised them to check re: SNF, dementia support groups, and SW with either SNF or support group about potentially making arragements.  She understood.    Complete Medication List: 1)  Zetia 10 Mg Tabs (Ezetimibe) .... Take one by mouth daily 2)  Plavix 75 Mg Tabs (Clopidogrel bisulfate) .... Take  1 tablet by mouth once a day 3)  Metoprolol Tartrate 25 Mg Tabs (Metoprolol tartrate) .Marland Kitchen.. 1 tab  by mouth two times a day 4)  Crestor 10 Mg Tabs (Rosuvastatin calcium) .Marland Kitchen.. 1daily by mouth 5)  Keppra Xr 500 Mg Tb24 (Levetiracetam) .Marland Kitchen.. 1 tablet twice a day by mouth 6)  Amlodipine Besylate 5 Mg Tabs (Amlodipine besylate) .... One tab by mouth at night 7)  Roller Equities trader (Misc. devices) .... Use as directed cpt 438.20, 845.00,250.00,433.10 medical necessity 8)  Multivitamins Tabs (Multiple vitamin) .Marland Kitchen.. 1 daily by mouth 9)  Tylenol 325 Mg Tabs (Acetaminophen) .... 2 tabs by mouth every 4 hrs as needed for pain 10)   Lisinopril 40 Mg Tabs (Lisinopril) .... Take one by mouth every day 11)  Vitamin D 1000 Unit Tabs (Cholecalciferol) .... One tab by mouth two times a day 12)  Ranitidine Hcl 150 Mg Caps (Ranitidine hcl) .... One tab by mouth once daily 13)  Aspirin 81 Mg Tbec (Aspirin) .Marland Kitchen.. 1 daily by mouth 14)  Colace 100 Mg Caps (Docusate sodium) .... Take 1 capsule by mouth two times a day as needed for constipation 15)  Aricept 10 Mg Tabs (Donepezil hcl) .Marland Kitchen.. 1 by mouth once daily  Patient Instructions: 1)  Ask Aram Beecham about the medication assistance program for plavix and see what can be done.   2)  I would work on the range of motion exercises for your right shoulder.   3)  I would increase  the aricept to 10mg  a day.   4)  Recheck in 1 month, .   Prescriptions: AMLODIPINE BESYLATE 5 MG TABS (AMLODIPINE BESYLATE) one tab by mouth at night  #90 x 3   Entered and Authorized by:   Crawford Givens MD   Signed by:   Crawford Givens MD on 05/10/2010   Method used:   Print then Give to Patient   RxID:   [id number] PLAVIX 75 MG TABS (CLOPIDOGREL BISULFATE) Take 1 tablet by mouth once a day  #90 x 3   Entered and Authorized by:   Crawford Givens MD   Signed by:   Crawford Givens MD on 05/10/2010   Method used:   Print then Give to Patient   RxID:   [id number] ZETIA 10 MG TABS (EZETIMIBE) Take one by mouth daily  #90 x 3   Entered and Authorized by:   Crawford Givens MD   Signed by:   Crawford Givens MD on 05/10/2010   Method used:   Print then Give to Patient   RxID:   [id number] ARICEPT 10 MG TABS (DONEPEZIL HCL) 1 by mouth once daily  #30 x 1   Entered and Authorized by:   Crawford Givens MD   Signed by:   Crawford Givens MD on 05/10/2010   Method used:   [facility] [id number]       [address]       [place], [place]  [address]       Ph: [phone] or [phone]       Fax: [phone]   RxID:   [id number]    Orders Added: 1)  Est.  Patient Level IV [73220]    Current Allergies (reviewed today): ! MORPHINE SULFATE (MORPHINE SULFATE)

## 2010-05-22 NOTE — Miscellaneous (Signed)
   Clinical Lists Changes  Observations: Added new observation of HGB: 12.4 g/dL (04/54/0981 1:91) Added new observation of CREATININE: 1.50 mg/dL (47/82/9562 1:30)

## 2010-05-28 LAB — CULTURE, BLOOD (ROUTINE X 2): Culture: NO GROWTH

## 2010-05-28 LAB — BASIC METABOLIC PANEL
BUN: 13 mg/dL (ref 6–23)
Chloride: 104 mEq/L (ref 96–112)
GFR calc non Af Amer: 47 mL/min — ABNORMAL LOW (ref >60)
Potassium: 4.2 mEq/L (ref 3.5–5.1)
Sodium: 138 mEq/L (ref 135–145)

## 2010-05-28 LAB — DIFFERENTIAL
Eosinophils Absolute: 0.1 10*3/uL (ref 0.0–0.7)
Eosinophils Relative: 2 % (ref 0–5)
Eosinophils Relative: 4 % (ref 0–5)
Lymphocytes Relative: 14 % (ref 12–46)
Lymphocytes Relative: 15 % (ref 12–46)
Lymphs Abs: 0.9 10*3/uL (ref 0.7–4.0)
Lymphs Abs: 1 10*3/uL (ref 0.7–4.0)
Monocytes Absolute: 0.7 10*3/uL (ref 0.1–1.0)
Monocytes Relative: 10 % (ref 3–12)
Monocytes Relative: 10 % (ref 3–12)
Neutrophils Relative %: 71 % (ref 43–77)

## 2010-05-28 LAB — URINE MICROSCOPIC-ADD ON

## 2010-05-28 LAB — URINALYSIS, ROUTINE W REFLEX MICROSCOPIC
Bilirubin Urine: NEGATIVE
Glucose, UA: NEGATIVE mg/dL
Hgb urine dipstick: NEGATIVE
Ketones, ur: NEGATIVE mg/dL
Specific Gravity, Urine: 1.005 (ref 1.005–1.030)
pH: 6.5 (ref 5.0–8.0)

## 2010-05-28 LAB — CBC
HCT: 39.1 % (ref 36.0–46.0)
Hemoglobin: 13.4 g/dL (ref 12.0–15.0)
MCHC: 33.9 g/dL (ref 30.0–36.0)
MCV: 92.7 fL (ref 78.0–100.0)
MCV: 93 fL (ref 78.0–100.0)
Platelets: 163 10*3/uL (ref 150–400)
RBC: 3.59 MIL/uL — ABNORMAL LOW (ref 3.87–5.11)
RDW: 13.3 % (ref 11.5–15.5)
WBC: 6.5 10*3/uL (ref 4.0–10.5)

## 2010-05-28 LAB — COMPREHENSIVE METABOLIC PANEL
ALT: 13 U/L (ref 0–35)
AST: 18 U/L (ref 0–37)
CO2: 26 mEq/L (ref 19–32)
Calcium: 9.3 mg/dL (ref 8.4–10.5)
Creatinine, Ser: 1.15 mg/dL (ref 0.4–1.2)
GFR calc Af Amer: 55 mL/min — ABNORMAL LOW (ref >60)
GFR calc non Af Amer: 45 mL/min — ABNORMAL LOW (ref >60)
Sodium: 141 mEq/L (ref 135–145)
Total Protein: 5.9 g/dL — ABNORMAL LOW (ref 6.0–8.3)

## 2010-05-28 LAB — GLUCOSE, CAPILLARY: Glucose-Capillary: 102 mg/dL — ABNORMAL HIGH (ref 70–99)

## 2010-05-29 NOTE — Letter (Signed)
Summary: [facility]   [facility]   Imported By: Kassie Mends 05/21/2010 10:36:45  _____________________________________________________________________  External Attachment:    Type:   Image     Comment:   External Document

## 2010-06-08 ENCOUNTER — Encounter: Payer: Self-pay | Admitting: Family Medicine

## 2010-06-11 ENCOUNTER — Ambulatory Visit (INDEPENDENT_AMBULATORY_CARE_PROVIDER_SITE_OTHER): Payer: Medicare Other | Admitting: Family Medicine

## 2010-06-11 ENCOUNTER — Encounter: Payer: Self-pay | Admitting: Family Medicine

## 2010-06-11 DIAGNOSIS — R079 Chest pain, unspecified: Secondary | ICD-10-CM

## 2010-06-11 DIAGNOSIS — R413 Other amnesia: Secondary | ICD-10-CM

## 2010-06-11 MED ORDER — DONEPEZIL HCL 10 MG PO TABS: 10.0000 mg | ORAL_TABLET | Freq: Every day | ORAL | Status: DC

## 2010-06-11 NOTE — Assessment & Plan Note (Signed)
H/o MI and vasc disease.  CP free now.  I wouldn't change meds at this point.  Goal for secondary prevention and comfort.  They'll let me know if sx inc.  I would not rec aggressive intervention given her other problems, esp dementia.  Daughter agrees.

## 2010-06-11 NOTE — Assessment & Plan Note (Addendum)
Improved per family and now with 2/3 on recall.  This is improved.  I wouldn't change meds and I would have patient fu in 3 months.  D/w daughter and she agrees.  We are working for quality of life interventions.  >25 min spent with patient, at least half of which was spent on counseling ZO:XWRU.

## 2010-06-11 NOTE — Progress Notes (Signed)
Here with daughter today.  They have noted that she is "holding her own."  "She isn't better, but it isn't worse and she isn't declining."  Sleeping well.  Eating well per family but she doesn't like the food at the home.  Less wandering- she is supervised on outside walks.  She is not having to wear the location brace.  No FCNAVD.  No dysuria.    Memory testing Year: "I don't know, 2006" Season: summer Location: MD office.   Doesn't know the floor (1st) Oriented to self.   WORLD--> DLROW Ball, flag, tree--> 2/3 (this is improved from prev)  Occ chest pain, but patient isn't able to give other info about this CP free now.  Daughter has heard her mention it episodically.  Known h/o CAD.    Meds, vitals, and allergies reviewed.   ROS: See HPI.  Otherwise, noncontributory.  GEN: nad, alert but not oriented to place/time.  Oriented to self.   HEENT: mucous membranes moist NECK: supple w/o LA CV: rrr. SEM noted PULM: ctab, no inc wob ABD: soft, +bs EXT: trace edema SKIN: no acute rash  CN 2-12 wnl B, S/S/DTR wnl x4, walks with walker

## 2010-06-11 NOTE — Patient Instructions (Signed)
Schedule a follow up appointment in: 3 months. OV.  Don't change your meds.  Take care.  Glad to see you today.

## 2010-07-02 ENCOUNTER — Encounter: Payer: Self-pay | Admitting: Family Medicine

## 2010-07-24 NOTE — Assessment & Plan Note (Signed)
[facility]                            CARDIOLOGY OFFICE NOTE   NAME:Grall, BREYONNA NAULT                  MRN:          [id number]  DATE:01/09/2007                            DOB:          04-02-1925    The patient is a very pleasant 77 year old female with a past medical  history of PCI of her LAD.  This was last performed in May of 2006.  Her  most recent Myoview was performed on September 16, 2006.  Her LV function was  normal.  There was apical thinning, but no scar or ischemia.  Since I  last saw her, she does occasionally have chest pain which has been a  chronic issue.  These episodes have occurred in the left lateral chest  wall.  They occur predominantly when she is in bed by her report.  They  are described as a sharp sensation that lasts 2-3 minutes.  They are not  pleuritic or positional, nor are they related to food.  She does not  have exertional chest pain.  Note, she denies any dyspnea, although she  can occasionally have mild pedal edema.  There are no palpitations or  syncope.   MEDICATIONS:  1. Aspirin 325 mg p.o. daily.  2. Plavix 75 mg p.o. daily.  3. Lotrel unknown dose.  4. Multivitamin.  5. Zetia 10 mg p.o. daily.  6. Toprol 25 mg p.o. daily.  7. Crestor 10 mg p.o. daily.   PHYSICAL EXAMINATION:  VITAL SIGNS:  Blood pressure initially 184/92 and  follow-up was 160/70.  Pulse 73, she weighs 150 pounds.  HEENT:  Normal.  NECK:  Supple with no bruits.  CHEST:  Clear.  CARDIOVASCULAR:  Regular rate and rhythm.  ABDOMEN:  No tenderness.  EXTREMITIES:  Trace edema.   Her electrocardiogram shows a sinus rhythm at a rate of 67.  There are  no ST changes noted.   DIAGNOSIS:  1. Chest pain.  Her chest pain is atypical and this has been a chronic      issue for her.  Her recent Myoview showed no ischemia and her      electrocardiogram is normal.  We will continue with medical therapy      including her aspirin, Plavix, statin, ACE  inhibitor, and beta      blocker.  2. Coronary artery disease, as per #1.  3. Remote history of cerebrovascular accident.  4. Hypertension.  Her blood pressure remains elevated today.  She is      not clear on how much Lotrel she is taking.  I have asked her to      contact us and we will increase based on her dose at present.  5. Hyperlipidemia.  She will continue on her statin and her recent      lipids were excellent.   We will see her back in six months.     Madolyn Frieze Jens Som, MD, [facility]     BSC/MedQ  DD: 01/09/2007  DT: 01/10/2007  Job #: [id number]   cc:   Arta Silence, MD

## 2010-07-24 NOTE — Consult Note (Signed)
NAME:  Angela [name], Angela [name]           ACCOUNT NO.:  [id number]   MEDICAL RECORD NO.:  [id number]          PATIENT TYPE:  EMS   LOCATION:  MAJO                         FACILITY:  [facility]   PHYSICIAN:  Pramod P. Pearlean Brownie, MD    DATE OF BIRTH:  04-Jun-1925   DATE OF CONSULTATION:  DATE OF DISCHARGE:                                 CONSULTATION   REFERRING PHYSICIAN:  Zadie Rhine, MD.   REASON FOR REFERRAL:  Stroke.   HISTORY OF PRESENT ILLNESS:  Angela [name] is an 77 year old Caucasian  lady who this morning noticed right facial droop and some confusion and  headache.  She called her daughter who arrived and noticed that she was  off balance, was dragging her right foot more than usual and appeared to  be confused.  She says her symptoms resolved in a short period of time  and hence the daughter took her to church, but subsequently, the  symptoms recurred and then she was brought back to the emergency room.  She has been noting some improvement, but remains still little confused.  She does have a prior history of left brain infarct in 2001.  She had  some short-term memory difficulties, confusion, and some dragging of the  right foot which is residual deficits which have not improved  completely.  She has been followed by Dr. Tawana Scale office, the last  visit was more than a year ago.   PAST MEDICAL HISTORY:  Significant for:  1. MI in 2001.  2. Hypertension.  3. Stroke.  4. Coronary artery disease.  5. Macular degeneration.  6. Rheumatic fever.  7. Cardiac stents.   HOME MEDICATIONS:  Aspirin, Plavix, Crestor, Zetia, metoprolol, Keppra,  and nitroglycerin.   SOCIAL HISTORY:  She lives alone.  She does not smoke or drink.  She is  independent with activities of daily living.   REVIEW OF SYSTEMS:  Twelve system review of systems documented  separately in the chart, positive for some joint pain, stiffness,  numbness, tingling, headache, and gait difficulty.   PHYSICAL  EXAMINATION:  GENERAL:  A frail, elderly Caucasian lady who is  at present not in distress.  She is afebrile.  VITAL SIGNS:  Pulse rate is 76 per minute and regular sinus, respiratory  rate is 18 per minute, blood pressure 176/75, and temperature 98.1.  HEAD:  Nontraumatic.  NECK:  Supple, there is no bruit.  ENT:  Unremarkable.  CARDIAC:  Regular heart sounds.  LUNGS:  Clear to auscultation.  NEUROLOGIC:  The patient is awake and alert.  She is oriented to time,  place, and person.  She is a little distracted.  She appears to be a  little confused and mixes up dates of doctors, recent doctor visits.  She has no aphasia, dysarthria, or apraxia.  She follows commands well.  She has a mild left eye ptosis, which the daughter notes as old.  She  has poor vision in the right eye, which is old from macular  degeneration.  Eye movements are full range.  There is right facial  symmetry with right lower facial weakness.  Tongue is  midline.  MOTOR SYSTEM:  No upper extremity drift.  She has symmetric strength and  tone, reflexes are brisk, plantars are downgoing.  She walks with  dragging of the right foot with slightly unsteady gait.   LABORATORY DATA:  Noncontrast CAT scan of the head revealed old left  basal ganglia infarct as well as an adjacent separate age old infarct as  well with mild general atrophy.  MRI scan of the brain from July 2007  shows similar infarcts.  MRI of the brain at that time showed no large  vessel stenosis.   IMPRESSION:  An 77 year old lady with previous history of left basal  ganglia infarct, likely from small vessel disease with fine residual  deficit with new onset of right facial weakness, confusion likely from  __________ subcortical infarct.  The symptoms began in sleep and she is  presented beyond 6 hours and hence is not a candidate for aggressive  intervention.  I would recommend that maintaining the patient for 23-  hour observation, checking MRI scan on  the brain with MRI of the brain  and neck, fasting lipid profile, hemoglobin A1c, and homocysteine.  Physical and occupational therapy consults.  Continue aspirin and  Plavix.  If the cardiac disease is not very active, then have Cardiology  change the Plavix to Aggrenox.  I had a long discussion with the patient  and daughter with regards to her symptom.  Discussed plan for evaluation  and treatment, answered questions.  Kindly call for questions.  She will  follow up electively as an outpatient with Dr. Anne Hahn after discharge.           ______________________________  Sunny Schlein. Pearlean Brownie, MD     PPS/MEDQ  D:  11/06/2007  T:  11/07/2007  Job:  [id number]

## 2010-07-24 NOTE — Discharge Summary (Signed)
[name], [name] NO.:  [id number]   MEDICAL RECORD NO.:  [id number]          PATIENT TYPE:  INP   LOCATION:  3015                         FACILITY:  [facility]   PHYSICIAN:  Sandford Craze, NP DATE OF BIRTH:  16-Jul-1925   DATE OF ADMISSION:  11/06/2007  DATE OF DISCHARGE:  11/10/2007                               DISCHARGE SUMMARY   DISCHARGE DIAGNOSES:  1. Transient ischemic attack, symptoms resolved.  2. Rule out developing pneumonia noted on chest x-ray, November 08, 2007, in the right lung.  3. Bilateral carotid stenosis 40-60%.  Will need outpatient      monitoring.  4. Hypertension.  5. Dyslipidemia.  6. Lipids, stable on home medications.  7. Rule out left breast mass.  The patient will need scheduling for      outpatient mammogram, defer to primary MD.  8. Urinary tract infection, plan to continue Ceftin as for pneumonia.   HISTORY OF PRESENT ILLNESS:  Angela [name] is an 77 year old white  female admitted, November 06, 2007, with confusion and new right-sided  facial droop since the morning of admission.  She has a history of  stroke and minimal right upper extremity hemiparesis deficit, who  presented to the [facility] emergency room on November 06, 2007,  complaining of new confusion and right facial droop.  The family placed  a phone call to Dr. Theadore Nan office who recommend evaluation in the  emergency room.  She was admitted for further evaluation and treatment.   PAST MEDICAL HISTORY:  1. History of CVA x2, last one in 2001.  2. History of coronary artery disease, status post stent.  3. Hypertension.  4. Dyslipidemia.  5. Diabetes type 2, diet controlled.  6. A1c in February 2009, with 6.0.  7. History of peripheral vascular disease with bilateral ICA stenosis,      approximately 40%.  8. Progressive memory loss.   COURSE OF HOSPITALIZATION:  1. TIA.  The patient was admitted.  Head CT was performed in the      emergency department,  which showed an old left hemispheric stroke      with scattered white matter disease and no acute changes.  The      patient was seen in consultation by the Stroke Team, Dr. Delia Heady.  It was recommended that the patient undergo an MRI.  MRI      did not show any acute CVA.  MRA, however, did note and progressive      stenosis of the distal right M1 segment and left cavernous carotid      artery less than 50%.  It did note right ICA stenosis 75-80% and      left ICA stenosis less than 50%.  As results of the ICA stenosis      noted on MRA, the carotid duplex was performed on November 09, 2007.      Carotid duplex noted bilateral 40-60% ICA stenosis, left greater      than right.  At this time, we will plan to continue aspirin and  Plavix.  She will need outpatient monitoring of carotid artery      stenosis per primary MD.  2. Rule out pneumonia.  There is question of a developing right-sided      pneumonia on November 08, 2007, chest x-ray.  A speech therapy      consult was requested to rule out aspiration.  The patient      underwent a modified barium swallow on November 09, 2007, which did      not show signs of aspiration.  She will be continued on a regular      diet.  We will also continue the patient on oral Ceftin at time of      discharge to complete a seven-day total treatment, today is her      third day of IV Rocephin.  3. Rule out left breast mass.  The patient now concerned about      question of a lump in her left breast.  Upon exam, she is noted to      have a fibrocystic breast, difficulty discerned distinct lesion,      however, it was recommended to the patient that she undergo a      closely scheduled outpatient mammogram for further evaluation and      she notes that she is overdue for her screening mammogram.   MEDICATIONS AT TIME OF DISCHARGE:  1. Aspirin 325 mg p.o. daily.  2. Crestor 10 mg p.o. nightly.  3. Metoprolol tartrate 25 mg 1.5 tablet p.o.  b.i.d.  4. Plavix 75 mg p.o. daily.  5. Zetia 10 mg p.o. daily.  6. Amlodipine/benazepril 10/20 one tablet daily.  7. Keppra 500 mg p.o. b.i.d.  8. Ceftin 500 mg p.o. b.i.d. x4 additional days and started on      November 11, 2007.   DISPOSITION:  The patient will be discharged to home with home health PT  and OT.  The family is in the process of reviewing assisted-living  facilities and hope to move her to an assisted-living facility in the  next few weeks.   PERTINENT LABORATORIES AT TIME OF DISCHARGE:  TSH 2.114, hemoglobin A1c  5.9, and homocystine 11.3.  Urinalysis 3-6 white blood cells and small  leukocytes.  Troponin negative x3.   FOLLOWUP:  The patient is scheduled for followup with Dr. Laurita Quint on Tuesday, November 17, 2007, 12:15 p.m.  At this visit, she  will need assistance with arranging an outpatient mammogram.   Greater than 30 minutes were spent on discharge planning.      Sandford Craze, NP     MO/MEDQ  D:  11/10/2007  T:  11/11/2007  Job:  [id number]   cc:   Arta Silence, MD

## 2010-07-24 NOTE — Assessment & Plan Note (Signed)
[name] OFFICE NOTE   NAME:WHITFIELDShenia, Angela                  MRN:          [id number]  DATE:04/29/2008                            DOB:          Jul 20, 1925    PRIMARY CARE PHYSICIAN:  Arta Silence, MD at [facility].   HISTORY OF PRESENT ILLNESS:  This is an 77 year old with a history of  coronary artery disease status post PCI of the LAD, diabetes, strokes,  and hypertension who presents to Cardiology Clinic for evaluation of an  episode of chest pain.  The patient's last intervention was in 2006.  She had in-stent restenosis of a proximal LAD stent.  She had cutting  balloon angioplasty at that time.  Since then she has had episodes of  brief atypical chest pain that are relatively chronic, happening every  few days, and tending to be in the left lateral chest and lasting for  about 5 or 6 minutes at a time.  However, about 5 or 6 days ago, she  developed quite significant shooting pain across her mid chest, this was  not related to exertion.  She noticed this one morning when she woke up  and the pain actually lasted she thinks for 3-4 days total.  It was  initially quite severe and then decreased in intensity over time;  however, did not completely resolve as mentioned for 3-4 days.  She is  having no pain at all now.  She says this is not similar to her chronic  chest pain pattern.  She says she thinks it does feel little bit like  that the type of pain that she had before her coronary interventions  though history is difficult given the patient's mild dementia and poor  memory.  The patient also reported some shortness of shortness of breath  after walking about 50-100 feet.  She lives at [facility].  She had been having some leg swelling; however, she has been  wearing compression stockings and that has resolved.  She has no  orthopnea or PND.  She has had no  episodes of syncope or presyncope.   PAST MEDICAL HISTORY:  1. Coronary artery disease.  The patient did have a drug-eluting stent      placed in the proximal LAD and a bare-metal stent placed in the mid      LAD in February 2006.  She came back with unstable angina in May      2006, she was found to have 99% in-stent restenosis in the proximal      LAD stent with a 70% distal LAD lesion.  She had cutting balloon      angioplasty to the proximal LAD with good result.  EF was 65% on      the left ventriculogram at that time.  The patient then had a      Myoview done in July 2008.  EF was normal at that time and there      was no evidence for ischemia or infarction.  2. Type 2 diabetes.  3. Hypertension.  4.  Hyperlipidemia.  5. Macular degeneration.  6. Stroke in August 2009.  7. Mild dementia.  8. Chronic atypical chest pain.   SOCIAL HISTORY:  The patient lives in [facility].  Her son is  involved with her medical care and brings her here to the office today.  She does not smoke or drink alcohol.   FAMILY HISTORY:  Noncontributory.   MEDICATIONS:  1. Zetia 10 mg daily.  2. Lopressor 25 mg b.i.d.  3. Plavix 75 mg daily.  4. Crestor 10 mg daily.  5. Keppra.  6. Norvasc 5 mg daily.  7. Aspirin 325 mg daily.  8. Lisinopril 20 mg daily.   REVIEW OF SYSTEMS:  Negative except as noted in the history of present  illness.   Most recent labs from January 2010, creatinine is 1.0 and potassium 4.5.   PHYSICAL EXAMINATION:  VITAL SIGNS:  Blood pressure 183/74, heart rate  56 and regular, weight 149 pounds.  GENERAL:  This is an elderly female in no apparent distress.  NEUROLOGIC:  Alert and oriented x3.  Normal affect.  LUNGS:  Clear to auscultation bilaterally with normal respiratory  effort.  NECK:  No JVD, no thyromegaly or thyroid nodule.  HEENT:  Normal exam.  LUNGS:  Clear.  CARDIOVASCULAR:  Heart regular.  S1 and S2.  No S3.  There is no S4.  There is a 2/6  crescendo-decrescendo early-to-mid peaking systolic  murmur at the right upper sternal border.  S2 is heard clearly.  Carotid  upstrokes appeared normal.  There is trace ankle edema.  There are 2+  posterior tibial pulses bilaterally.  There is no carotid bruit.  ABDOMEN:  Soft, nontender.  No hepatosplenomegaly.  Normal bowel sounds.  EXTREMITIES:  No clubbing or cyanosis.  SKIN:  No rash.  MUSCULOSKELETAL:  There is no joint tenderness or effusion.   ASSESSMENT/PLAN:  This is an 77 year old with history of coronary artery  disease status post LAD and PCI, diabetes, hypertension, hyperlipidemia,  stroke, some mild dementia who presents to Cardiology Clinic for  evaluation of chest pain.  1. Coronary artery disease.  The patient did have a prolonged episode      of chest pain.  Her memory is difficult, so she has a hard time      describing it very well though it seems like it was constant for a      couple of days.  The pain she says was quite severe and did seem      reminiscent of prior to her prior coronary procedures.  It was      different than her chronic atypical type chest pain that she has      been having for years.  Given her history of in-stent restenosis of      her proximal LAD stent, I do think it is reasonable to do a Myoview      to assess for any significant ischemia.  We will try her on the      treadmill and if she is not able to walk adequately on a treadmill,      we will change her over to an adenosine Myoview.  We will continue      her on her aspirin and her Plavix and her statin as well as her      beta-blocker.  She will hold her beta-blocker before the stress      test.  2. Heart murmur.  The patient does have an aortic-type heart murmur.  She may have some degree of aortic stenosis.  It does not sound      severe; however, we will go ahead and get an echocardiogram to      evaluate on the same day she has her Myoview.  Before she      exercises, we  will make sure she does not have severe aortic      stenosis.  She does get some mild shortness of breath with exertion      after walking for about 50-100 feet.  3. Hypertension.  The patient's blood pressure is 183/74.  Given her      history of stroke, I think it would be a good idea to get this      lower.  We will increase her lisinopril today to 40 mg a day.  She      will come back and get Chem-7 on the same day that she gets her      stress test.  4. Hyperlipidemia.  We will check the patient's lipids and liver      function.  This does not appear to be done for a while.     Marca Ancona, MD  Electronically Signed    DM/MedQ  DD: 04/29/2008  DT: 04/30/2008  Job #: [id number]   cc:   Arta Silence, MD

## 2010-07-24 NOTE — H&P (Signed)
Angela [name], Angela [name]           ACCOUNT NO.:  [id number]   MEDICAL RECORD NO.:  [id number]          PATIENT TYPE:  INP   LOCATION:  3015                         FACILITY:  [facility]   PHYSICIAN:  Raenette Rover. Felicity Coyer, MDDATE OF BIRTH:  1925-03-20   DATE OF ADMISSION:  11/06/2007  DATE OF DISCHARGE:                              HISTORY & PHYSICAL   PRIMARY CARE PHYSICIAN:  Arta Silence, MD   NEUROLOGIST:  Marlan Palau, MD   CHIEF COMPLAINT:  Confusion with new right facial droop since this  morning.   HISTORY OF PRESENT ILLNESS:  The patient is an 77 year old white female  with history of stroke and minimal right upper extremity hemiparesis  deficit, who presented to the [facility] Emergency Room today  complaining of new confusion with a right facial droop.  The symptoms  began some time this morning after 9 o'clock.  The patient believed she  awoke in her usual state of health and made a phone call to her daughter  who lives behind her complaining that her television was not working.  The daughter came over and found that in fact the television was not  working properly, but felt that the patient seems slightly confused,  talking her dogs to the beach, and there were no such plans.  The  patient, however, had a hair appointment at the beauty saloon for 9  o'clock and when daughter suggested that she drive the patient over, the  patient refused insisting that she drove herself.  She arrived at the  beauty saloon without problem, but there was noted to be continuously  confused.  Daughter came to pick up the patient at the beauty saloon due  to this confusion and at that time noticed the patient had a new right  facial droop in addition to her confusion.  They placed a phone call to  neurologist, Dr. Anne Hahn' office, who recommended evaluation in the  emergency room due to concern for new stroke given history of same.  There has been no trouble speaking or swallowing.  There  was complain of  a mild headache this morning prior to the saloon that the patient  described is at the back of her head.  She has chronic balance problems  and gait difficulty, but no change in these and no change either in her  distal upper extremity or lower extremity movement and strength.  At  this time, the patient persists with some degree of confusion such as  earlier this morning as well as the right noticeable facial droop.  No  complaints of headache at this time.  No chest pain or palpitations.  See details below.   PAST MEDICAL HISTORY:  Significant of:  1. CVA x2, last in 2001 by recollection with chronic deficits of right      upper extremity as described.  2. History of coronary artery disease, status post stent.  3. Hypertension.  4. Dyslipidemia.  5. Reported type 2 diabetes, which is diet controlled, A1c in February      2009 was 6.0.  6. History of peripheral vascular disease with bilateral ICA  stenosis      approximately 40%.  7. Progressive memory loss.   CURRENT MEDICATIONS:  1. Zetia 10 mg daily.  2. Plavix 75 mg daily.  3. Aspirin 81 mg daily.  4. Toprol-XL 25 mg daily.  5. Crestor 10 mg daily.  6. Multivitamin daily.   The patient believes she has been previously prescribed Keppra 500 mg,  but does not report she is taking this at this time and the daughter is  unsure.  Also, Lotrel 10/20 previously prescribed, the daughter and the  patient did not believe she is any longer taking this.   ALLERGIES:  No known drug allergies.   FAMILY HISTORY:  Has been reviewed and noncontributory to current  illness.   SOCIAL HISTORY:  She lives alone with her 4 little dogs.  Daughter lives  close by behind her in another house.  There is no tobacco or alcohol  history or current use.   REVIEW OF SYSTEMS:  CONSTITUTIONAL:  No fever or chills.  No travel.  RESPIRATIONS:  No cough or shortness of breath.  CARDIOVASCULAR:  No  chest pain or palpitations.  GI:  No  nausea, no vomiting, and no  abdominal pain.  ENT:  No hearing changes.  EYES:  No vision changes.  MUSCULOSKELETAL:  No joint pains or swelling.  NEUROLOGIC:  Positive for  headache.  No dizziness.  No falls.  See above regarding weakness, is  supposed to use a cane or walker for balance, but is noncompliant with  this frequently.   PHYSICAL EXAMINATION:  VITAL SIGNS:  Temperature is 98.1, blood pressure  176/75, pulse 76, respirations 18, and sating 97% on room air.  GENERAL:  She is an elderly woman, who is in no acute distress,  pleasant, but confused with her daughter present at bedside.  EYES:  PERRL.  EOMI.  ENT:  Dentures both upper and lower without oropharynx lesions.  No  thrush.  Hearing grossly normal.  NECK:  Supple.  There are no masses.  No thyromegaly or nodules.  RESPIRATIONS:  No increased work of breathing at rest or conversational  dyspnea.  Lung sounds are clear to auscultation bilaterally without  wheeze, crackle, or rhonchi.  CARDIOVASCULAR:  Regular rate and rhythm with a normal S1 and S2.  There  is a faint 2/6 systolic murmur, which the patient reports it is not new.  She has no lower extremity edema with positive varicosity changes in the  bilateral lower extremity chronically.  GI:  Abdomen, which is minimally distended and nontender.  Good bowel  sounds.  No appreciable hepatosplenomegaly or masses to palpation.  NEUROLOGIC:  There is a noticeable right facial droop and flatness, but  with some presence in the nasolabial fold.  Other cranial nerves appear  intact.  There is a minimal 4+/5 right upper extremity strength deficit,  which is chronic and there are no other gross motor deficits noted.  The  patient does have a confabulation type of confusion, but fluent speech  without dysarthria.  PSYCHIATRIC:  Awake, alert, and oriented x3.  She has a pleasant mood  and bright affect.  See above regarding confabulation.   LABORATORY DATA:  Normal white  count.  Normal hemoglobin.  Basic  metabolic also normal with glucose of 112.  Head CT shows an old left  hemispheric stroke with scattered white matter disease.  There is no  definitive acute changes.  EKG cannot be located at the time of this  dictation.  We will reorder if not done.   ASSESSMENT AND PLAN:  1. New-onset right facial droop with acute confusion.  Suspect      recurrent left brain cerebrovascular accident given history of      same, although head CT is negative.  We will plan to do an MRI and      MRA to look for these changes and admit for further treatment and      observation, onset greater than 6 hours and therefore not a      candidate for TPA.  We will continue medical management with      aspirin and Plavix as prior to admission and plan for PT/OT and ST      evaluation.  Diet as tolerated.  Depending on these results, we      will also check fasting lipid profile, hemoglobin A1c, and      homocystine.  MRI and MRA of the brain as well as MRA of the neck      in lieu of carotids given the weekend and inability to do carotid      Dopplers at this time.  Depending on the status of these results as      well as the patient's overall performance with therapy and her      improvement in confusion, we will consider discharge home to      continue medical management with outpatient followup at      neurologist, Dr. Lesia Sago, per direction of Neurology.      Appreciate Neurology assistance.  Please see orders for further      details.  2. Dyslipidemia.  Continue home Crestor and Zetia at this time.  We      will check a fasting lipid profile in the morning.  3. Hypertension, mildly elevated at this time.  We will not      aggressively treat in this acute setting of stroke.  Continue home      metoprolol and monitor.  Please see orders for further details.      Valerie A. Felicity Coyer, MD  Electronically Signed     VAL/MEDQ  D:  11/06/2007  T:  11/07/2007  Job:   [id number]

## 2010-07-24 NOTE — Assessment & Plan Note (Signed)
[facility]                            CARDIOLOGY OFFICE NOTE   NAME:Kincy, CERISSA ZEIGER                  MRN:          [id number]  DATE:09/03/2006                            DOB:          [birth year]    Mrs. Kuba is a 77 year old female with a history of coronary  disease.  Her last PCI was on Jul 19, 2004, and she had a cutting  balloon to her LAD at that time.  She also has chronic chest pain.  Since I last saw her, she continues to have occasional chest pain.  It  is in the left lateral chest area and radiates to her epigastric area.  It is not exertional.  Note:  She has dyspnea with more extreme  activities, but not with routine activities around the house.  There is  no orthopnea, PND or pedal edema.  There has been no syncope.   MEDICATIONS INCLUDE:  1. Aspirin 325 mg p.o. daily.  2. Plavix 75 mg p.o. daily.  3. Multivitamin daily.  4. Zetia 10 mg p.o. daily.  5. Toprol 25 mg p.o. daily.  6. Crestor 10 mg p.o. daily.  7. Lotrel 5/20 daily.   PHYSICAL EXAM TODAY:  Shows a blood pressure of 171/88 and her pulse is  86.  She weighs 154 pounds.  HEENT:  Normal.  NECK:  Supple with no bruits.  CHEST:  Clear.  CARDIOVASCULAR EXAM:  Reveals a regular rate and rhythm.  ABDOMINAL EXAM:  Benign.  EXTREMITIES:  Show varicosities, but no edema.   Her electrocardiogram today shows a sinus rhythm at a rate of 76.  There  is left axis deviation, but there are no ST changes noted.   DIAGNOSES:  1. Chest pain - her symptoms remain atypical.  We will plan to risk      stratify with an adenosine Myoview.  If it shows no ischemia, then      we will continue with medical therapy.  2. Coronary artery disease - she will continue on her aspirin, but we      will decrease this to 81 mg p.o. daily.  She will continue on her      Plavix, ACE inhibitor, beta blocker and statin.  3. Remote history of CVA.  4. Hypertension - her blood pressure is elevated  today and I have      asked her to increase her Lotrel to 10 mg/20 mg p.o. daily.  5. Hyperlipidemia - we will check lipids and liver when she returns      for her Myoview, as well as a BMET, given her ACE inhibitor use.      Goal LDL will be less than 70, given her history of coronary      disease.   We will see her back in six months.  She will continue the risk factor  modification.     Madolyn Frieze Jens Som, MD, [facility]  Electronically Signed    BSC/MedQ  DD: 09/03/2006  DT: 09/03/2006  Job #: [id number]   cc:   Arta Silence, MD

## 2010-07-27 NOTE — Discharge Summary (Signed)
NAMEMarland Kitchen  Angela [name], Angela [name] NO.:  [id number]   MEDICAL RECORD NO.:  [id number]          PATIENT TYPE:  INP   LOCATION:  6526                         FACILITY:  [facility]   PHYSICIAN:  Pricilla Riffle, M.D.    DATE OF BIRTH:  [birth year]   DATE OF ADMISSION:  04/30/2004  DATE OF DISCHARGE:  05/03/2004                           DISCHARGE SUMMARY - REFERRING   SUMMARY OF HISTORY:  Ms. Huffaker is a 77 year old female who presented  with chest discomfort that began on the day of admission between 10:30 and  11 a.m. while sitting in the chair reading the paper.  She described it as  sudden onset elephant sitting on me in the midsternal area, gave it an 8 on  a scale of 0 to 10.  Associated with shortness of breath, nausea and  dizziness.  She denied diaphoresis.  She also noted some discomfort in her  right arm.  The discomfort did not resolve but did decrease to approximately  2 to 3 on a scale of 0 to 10.  She has noticed she has fatigued easier in  the recent past.   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  Catheterization  January 2006, did show some coronary artery disease, recommended medical  treatment.  Echocardiogram  July 2005, showed an EF 45% with mild AI.   LABORATORY DATA:  Chest x-ray showed question mild interstitial edema.  CT  of the pelvis showed mild soft tissued stranding in the fat surrounding the  right common femoral vessels in the right groin, no defined hematoma or  fluid collection.   EKGs showed sinus bradycardia, normal sinus rhythm, nonspecific STT wave  changes.   Admission H&H was 12.0 and 34.8, normal indices, platelets 198, wbc 6.9.  At  the time of discharge, H&H was 10.0 and 29.6, normal indices, platelets 142,  wbc 4.6.  Sodium 140, potassium 3.9, BUN 8, creatinine 1.0.  Normal LFTs.  PT 13.8, PTT 33 on May 02, 2004.  Prior to discharge, sodium  was 135,  potassium 3.6, BUN 7, creatinine 1.1.  CK-MB and troponin x3 were negative  for myocardial infarction.  Post procedure CK-MB was also negative.  Admission BNP was 330.5.  Fasting lipids on May 01, 2004, showed total  cholesterol 124, triglycerides 116, HDL was low at 39, LDL 62.  TSH was  5.172.   HOSPITAL COURSE:  Ms. Kasparek was admitted to the hospital and placed on  Lovenox.  It was also noted on admission, she complained of some right thigh  pelvis pain.  Dr. Tenny Craw scheduled a CT for further evaluation.  Dr. Jens Som  assessed the patient on April 11, 2004, and noted that the patient had  ruled out for myocardial infarction.  With her reoccurring symptoms, he  recommended intervention on the LAD and the patient agreed to proceed.  Dr.  Jens Som reviewed the CT of the pelvis and felt that this was negative.  On  May 02, 2004, Dr. Gerri Spore performed PTCA with a kissing balloon to  the diagonal 2 and a CYPHER  drug eluting stent to the proximal LAD.  He  recommended Plavix for 12 months.  Post intervention, Dr. Gerri Spore assessed  the cath site and this was intact.  On May 03, 2004, Dr. Jens Som  assessed the patient and felt that the patient was doing well and that she  could be discharged home after her temperature was checked early this  afternoon.  He noted on the morning of May 03, 2004, that her  temperature was 100.7.  He felt that if subsequent temperature this  afternoon was unremarkable, she could be discharged home.   DISCHARGE DIAGNOSES:  1.  Unstable angina status post angioplasty of the diagonal drug eluting      stenting of the left anterior descending as previously described.  2.  Right groin pain of uncertain etiology.  3.  History of hypertension, hyperlipidemia and CVA.  4.  Normocytic anemia.   DISPOSITION:  The patient is discharged home with the new medications, Imdur  30 mg daily, nitroglycerin 0.4 as needed and Plavix 75 mg  daily for one  year.  She is asked to continue her Crestor 10 mg daily, aspirin 325 mg  daily, Lotrel 10/20 daily and metoprolol 50 mg b.i.d.  She was advised no  lifting, driving, sexual activity or heavy exertion for two days.  Maintain  low salt, fat and cholesterol diet.  If she has any problems with the  catheterization site, she was asked to call us immediately.   She will follow up with Dr. Jens Som on May 17, 2004, at 2:15 p.m.  She was  informed that her appointment that was previously scheduled for May 03, 2004, has been canceled.      EW/MEDQ  D:  05/03/2004  T:  05/03/2004  Job:  [id number]   cc:   Olga Millers, M.D. [facility]   Dr. Lucile Crater

## 2010-07-27 NOTE — Discharge Summary (Signed)
[facility]  Patient:    Angela [name], Angela [name]                  MRN: [id number] Adm. Date:  [id number] Disc. Date: [id number] Attending:  Enos Fling CC:         Elana Alm. Eliezer Lofts., M.D.                           Discharge Summary  HISTORY OF PRESENT ILLNESS:  Briefly, Angela [name] is a 77 year old ambidextrous widowed female with a history of hypertension which had been untreated over the past year.  Patient works as a Nutritional therapist at [employer].  She started to work on the day of admission, and she realized that she was unable to do her pages or type on a keyboard.  She could not think how to do things.  She was able to talk properly but unable to translate her thoughts into actions.  On the night prior to her admission, the patients sister noted that her speech was not "right" when she talked to her on the phone.  Patient had a little left-sided headache that day and fell asleep at the hairdressers which is quite  unusual for her.  She had detected some puffiness on the left side of her face ver the past day or two.  She denies specific weakness, numbness, or new imbalance. CT scan of the brain in the emergency room showed an acute, subacute left basal ganglia CVA involving the caudate head, globus pallidus, and internal capsule extending into the corona radiata.  PAST MEDICAL HISTORY:  Significant for hypertension followed by Dr. Nicholos Johns. Patient ran out of her medications one year ago and did not get them refilled.  PHYSICAL EXAMINATION:  Examination on admission showed a well-developed, well-nourished, moderately overweight lady appearing her stated age.  VITAL SIGNS:  Blood pressure 187/92, pulse 74, respirations 24, and temperature 99.  GENERAL:  Exam was unremarkable.  NEUROLOGIC:  Examination showed the patient to be alert and oriented with normal speech and pleasant affect. Cranial nerve  examination showed mild right facial droop but was otherwise intact except for a dense left cataract.  She had slightly decreased sensation in the right lower jaw.  Motor testing revealed no drift. There was slight weakness of the right hand grip and 5-/5 weakness in the right  lower extremity.  Deep tendon reflexes were intact.  Plantar responses were neutral on the right and downgoing on the left.  Sensation was intact.  Cerebellar testing was unremarkable and the patients gait was not tested.  Patient was admitted with the impression of 1) a left basal ganglia CVA 24 to 48 hours old, and 2) hypertension - untreated.  Plans on admission were to control her blood pressure, start aspirin and Plavix.  Further workup would include an MRI/MRA, carotid Dopplers, and 2D echocardiogram.  LABORATORY FINDINGS:  Admission CBC and differential showed a hemoglobin of 14.0 and a white cell count of 4.0.  Platelet count was 174,000.  There were 77% neutrophils, 9% lymphocytes, and 9% monocytes.  Sedimentation rate was 17 mm over the first hour.  PT and PTT were 13.6 with 28 seconds respectively. Comprehensive metabolic panel showed all indices to be within normal limits.  Repeat basic metabolic panel two days later also was entirely normal.  CT scan of the brain on February 16 was as described above.  There was also  a question of increased density within the basilar artery - ? calcification versus basilar thrombus.  Chest x-ray on February 17 showed no active disease.  MRI of the brain on February 17 showed mild atrophy and small vessel disease as well as a large acute infarction involving the left caudate, putamen, globus pallidus, and adjacent periventricular white matter.  MR angiography showed widespread intracranial atherosclerotic disease with the ost apparent significant change involving the right middle cerebral artery branches. No significant proximal stenosis was identified.   No evidence of basilar artery  thrombus was seen.  A 12-lead EKG was normal.  Carotid Doppler studies showed no significant internal carotid artery stenosis bilaterally and vertebral artery flow was antegrade bilaterally.  A 2D echocardiogram showed no evidence of thrombus nd was otherwise unremarkable study although technically difficult.  HOSPITAL COURSE:  Patient was admitted to the neurosciences unit.  T max was 100.2 during this hospitalization.  Blood pressures were initially somewhat elevated n the range of 200/70 to 100 but came down nicely after re-institution of the patients antihypertensive regimen.  Serial examinations showed no extension of er mild deficits.  Patient was followed by physical therapy and was noted to demonstrate a safe gait on level surfaces but had some problems with higher-level balance activities.  PT recommended a straight cane when outdoors. She underwent the above noted studies without difficulty and, based on these, it was felt that she should remain on antiplatelet agents.  By February 20, the patient was anxious to return home and was felt to have provided maximum hospital benefit.  She had a mild right facial droop with no significant drift.  She was able to raise both egs well off the bed and was ambulating.  Home health physical therapy was arranged at the time of discharge.  FINAL DIAGNOSES: 1. Left brain subcortical stroke. 2. History of hypertension.  DISCHARGE MEDICATIONS: 1. Plavix 75 mg p.o. q.d. 2. One coated aspirin per day. 3. Norvasc 10 mg p.o. q.d. 4. Zestril 10 mg p.o. q.d. 5. Hydrochlorothiazide 12.5 mg p.o. q.d. 6. Albuterol inhaler.  DISPOSITION:  The patient was discharged to home with instructions not to drive for at least one week and not to return to work until seen in the office.  She was o undergo home physical therapy and follow up with Dr. Nicholos Johns in the next one to two weeks and with Dr. Noreene Filbert in the  next three weeks.  CONDITION ON DISCHARGE:  Stable.  PROGNOSIS:  Good. DD:  06/20/99 TD:  06/20/99 Job: [id number]

## 2010-07-27 NOTE — Assessment & Plan Note (Signed)
[facility]                            CARDIOLOGY OFFICE NOTE   NAME:Angela [name], Angela [name]                  MRN:          [id number]  DATE:02/11/2006                            DOB:          February 16, 1926    Mrs. Chuong is a 77 year old female who has a history of coronary  disease and also chronic chest pain. Her last nuclear study was  performed in July of this year. At that time, she was found to have mild  apical thinning, but no ischemia and her ejection fraction was 71%.  Since I last saw her, she is doing reasonably well. She continues to  have occasional vague chest pain that is not exertional, nor is it  pleuritic or positional. It is not related to food. It lasts for several  minutes and resolves spontaneously. There is also occasional mild  dyspnea on exertion, but no orthopnea, PND or pedal edema.   Her medications include aspirin 325 mg p.o. daily, Plavix 75 mg p.o.  daily, Lotrel 5/10 mg p.o. daily, multivitamin, Zetia 10 mg p.o. daily,  Crestor 20 mg p.o. daily and Toprol 25 mg p.o. daily.   PHYSICAL EXAMINATION:  Shows a blood pressure of 132/80 and her pulse is  61.  Her neck is supple with no bruits.  Her chest is clear.  CARDIOVASCULAR: Is a regular rate and rhythm.  EXTREMITIES: Show no edema.   Her electrocardiogram shows a sinus rhythm at a rate of 62. There is  left axis deviation, but no ST changes noted.   DIAGNOSES:  1. Coronary artery disease.  2. History of recurrent atypical chest pain.  3. Remote history of cerebral vascular accident.  4. Hypertension.  5. Hyperlipidemia.   PLAN:  Mrs. Drolet continues to have vague chest pain that does not  sound cardiac. Her recent nuclear studies showed normal left ventricular  function and apical thinning, but no ischemia. I think we should  continue with medical therapy. Note, her electrocardiogram shows no ST  changes. We will check lipids and liver and adjust her  medical regimen  as indicated with a goal LDL of less than 70. Her blood pressure is well-  controlled. I have stressed diet and exercise. I will see her back in 6  months.     Madolyn Frieze Jens Som, MD, [facility]  Electronically Signed   BSC/MedQ  DD: 02/11/2006  DT: 02/11/2006  Job #: [id number]   cc:   Arta Silence, MD

## 2010-07-27 NOTE — Cardiovascular Report (Signed)
[name], Angela [name]           ACCOUNT NO.:  [id number]   MEDICAL RECORD NO.:  [id number]          PATIENT TYPE:  OIB   LOCATION:  6501                         FACILITY:  [facility]   PHYSICIAN:  Rollene Rotunda, M.D.   DATE OF BIRTH:  04-Sep-1925   DATE OF PROCEDURE:  04/09/2004  DATE OF DISCHARGE:                              CARDIAC CATHETERIZATION   PROCEDURE:  Left heart catheterization/coronary arteriography.   INDICATIONS FOR PROCEDURE:  Evaluate patient with chest pain and an abnormal  Cardiolite suggesting anteroapical ischemia.   PROCEDURAL NOTE:  Left heart catheterization was performed via the right  femoral artery.  The artery was cannulated using the anterior wall puncture.  A #4 French arterial sheath was inserted via the modified Seldinger  technique.  Preformed Judkins and a pigtail catheter were utilized.  The  patient tolerated the procedure well and left the lab in stable condition.   RESULTS:  Hemodynamics:  LV 166/21, AO 164/66.   Coronaries:  The left main was normal.  The LAD had proximal 70% stenosis  before a large diagonal.  There was 99% stenosis after the diagonal.  There  was distal 50% stenosis.  The mid distal vessel after the diagonal was a  narrow caliber vessel.  First diagonal was very large and normal.  The  circumflex essentially consisted of a ramus intermediate which was a  moderate size vessel.  This was normal.  The right coronary artery was a  very large dominant vessel.  There was long proximal 25% stenosis.  There  were diffuse luminal irregularities.  PDA was large and normal.  A  posterolateral was large and normal.   Left ventriculogram:  The left ventriculogram was obtained in the RAO  projection. The EF was 60% and normal   CONCLUSION:  Severe single vessel coronary artery disease.   PLAN:  I reviewed with films with Dr. Samule Ohm.  Question will be medical  management versus percutaneous revascularization.      JH/MEDQ  D:   04/09/2004  T:  04/09/2004  Job:  [id number]   cc:   Laurita Quint, M.D.  [address] [place]  [place] [address]  Fax: [phone]   Olga Millers, M.D. [facility]

## 2010-07-27 NOTE — Cardiovascular Report (Signed)
NAMEMarland [name]  AAMARI, WEST NO.:  [id number]   MEDICAL RECORD NO.:  [id number]          PATIENT TYPE:  INP   LOCATION:  6524                         FACILITY:  [facility]   PHYSICIAN:  Salvadore Farber, M.D. LHCDATE OF BIRTH:  07/24/1925   DATE OF PROCEDURE:  07/19/2004  DATE OF DISCHARGE:                              CARDIAC CATHETERIZATION   PROCEDURES:  1.  Left heart catheterization.  2.  Left ventriculography.  3.  Coronary angiography.  4.  Cutting balloon angioplasty of the mid LAD for in-stent restenosis.   INDICATIONS:  Ms. Angela [name] is a 77 year old lady who presents with unstable  angina. She is status post placement of a drug eluting stent in the proximal  LAD and a 2.0 mm Mini-Vision bare metal stent in the mid LAD in February  2006 by Dr. Gerri Spore.  She now returns with chest discomfort occurring  primarily at rest.  She was admitted to the hospital and ruled out for  myocardial infarction.  She was then referred for diagnostic angiography  with an eye to possible percutaneous revascularization.   PROCEDURAL TECHNIQUE:  Informed consent was obtained.  Under 1% lidocaine  with local anesthesia,  a 5-French sheath was placed in the left common  femoral artery using a modified Seldinger technique.  Diagnostic angiography  and ventriculography were performed using JL-4, JR-4 and pigtail catheters.  These images demonstrated 99% in-stent restenosis of the mid-LAD stent.  Decision was made to proceed to percutaneous revascularization.   Additional heparin was given to maintain an ACT of greater than 200 sec.  The patient had been maintained on Plavix chronically.  She had previously  been initiated on eptifibatide; these were continued.  The sheath was  upsized over a wire to 6-French.  A CLS 3.5 guide was advanced over a wire  and engaged in the ostium of the left main.  Prowater wire was advanced  across the lesion into the distal LAD without difficulty.   The lesion was  then treated with 2.0 x 6 mm cutting balloon, for a total of five inflations  at 10 atm.  Repeat angiography demonstrated residual stenosis of  approximately 10%, no dissection and TIMI-III flow to the distal  vasculature.   The patient tolerated the procedure well and was transferred to the holding  room in stable condition.   COMPLICATIONS:  None.   FINDINGS:  1.  LV:  150/5/13.  2.  EF:  65% without regional wall motion abnormality.  3.  No aortic stenosis or mitral regurgitation.  4.  LEFT MAIN:  Angiographically normal.  5.  LEFT ANTERIOR DESCENDING:  Moderate sized vessel giving rise to one      large diagonal.  The previously placed stent in the proximal LAD is      widely patent.  The diagonal is widely patent.  However, the stent in      the mid LAD beginning just after the takeoff of the diagonal has 99%      restenosis throughout its proximal two-thirds.  This was treated with      cutting balloon with excellent result.  The  distal LAD is very small and      has a 70% stenosis.  6.  CIRCUMFLEX:  Small vessel.  Minor luminal irregularities.  7.  RIGHT CORONARY ARTERY:  Very large, dominant vessel.  There is a 20%      stenosis of the proximal vessel, and a 30% stenosis of the distal      vessel.   IMPRESSION/RECOMMENDATIONS:  Successful cutting balloon angioplasty for  severe in-stent restenosis of the mid LAD.  Given the very small size of the  stent, risk of recurrent restenosis is substantial.  Unfortunately, brachy  therapy is no longer available either here or anywhere else in [place].  Management strategy will therefore be repeat balloon dilations  until a small diameter drug eluting stent becomes available.  It is expected  that such will be approved by the FDA in late 2006.  The patient will be  maintained on aspirin.  Plavix should be continued at a minimum through  February 2007.      WED/MEDQ  D:  07/19/2004  T:  07/20/2004  Job:   [id number]   cc:   Olga Millers, M.D. [facility]   Laurita Quint, M.D.  [address] [place]  [place] [address]  Fax: [phone]

## 2010-07-27 NOTE — Consult Note (Signed)
NAMELATOYNA, HIRD NO.:  [id number]   MEDICAL RECORD NO.:  [id number]          PATIENT TYPE:  INP   LOCATION:  2905                         FACILITY:  [facility]   PHYSICIAN:  Di Kindle. Edilia Bo, M.D.DATE OF BIRTH:  Jun 17, 1925   DATE OF CONSULTATION:  07/24/2004  DATE OF DISCHARGE:                                   CONSULTATION   REASON FOR CONSULTATION:  Retroperitoneal hematoma and left common femoral  artery pseudoaneurysm.  Consult is from Dr. Andee Lineman.   HISTORY:  This is a pleasant 77 year old woman with a history of coronary  artery disease. She underwent previous cardiac catheterization and  intervention in February of 2006.  She had presented on Jul 18, 2004 with  chest pain and underwent catheterization the following day via a left  femoral approach. She had an intrastent stenosis which was successfully  treated and has done well since then with essentially no residual chest  pain. She had had some abdominal pain and, on Jul 21, 2004, she had a CT  scan of the abdomen and pelvis which showed a small retroperitoneal  hematoma. She continued to have some groin pain and a duplex scan was  obtained on Jul 23, 2004 which showed a small pseudoaneurysm and possibly AV  fistula on the left groin. Vascular surgery was consulted for further  recommendations.   Currently, she has only minimal abdominal pain. Of note, she did receive 2  units packed red blood cells early on after her retroperitoneal hematoma was  diagnosed. Prior to this admission, she has had no history of significant  claudication, rest pain or nonhealing ulcers.   PAST MEDICAL HISTORY:  1.  Hypertension.  2.  Hypercholesterolemia.  3.  Coronary artery disease status post angioplasty and subsequent      reintervention.  4.  History of two strokes, one in June of 2005, she cannot remember the      specific symptoms, and a more significant stroke in 2002 which was      associated with some  right-sided weakness.   MEDICATIONS:  The medications are documented in her admission history and  physical.   SOCIAL HISTORY:  She is widowed. She has four children. She lives in  [place] independently. She does not use tobacco and never has.   FAMILY HISTORY:  There is no history of premature cardiovascular disease.  Her mother apparently had a aneurysm of her heart.  She has had two brothers  who have had heart problems in their 13s.   REVIEW OF SYSTEMS:  She has had no recent weight loss, weight gain or  problems with her appetite. She has had no chills. CARDIAC:  Her chest pain  has resolved. She has had no significant palpitations or arrhythmias. She  has no significant orthopnea. PULMONARY:  She has had no bronchitis, asthma  or wheezing or history of COPD. GI:  She has had no recent change in her  bowel habits and has no history of peptic ulcer disease. GU:  She had no  dysuria or frequency.  VASCULAR:  She has had no previous history of DVT or  phlebitis. She has had two strokes in the past as documented above. Review  of systems is otherwise unremarkable.   PHYSICAL EXAMINATION:  VITAL SIGNS:  T max 100.4. Currently, temperature is  99, blood pressure 180/85, heart rate 88, sat 92.  HEENT:  There is no cervical lymphadenopathy. I do not detect any carotid  bruits.  LUNGS:  Clear bilaterally to auscultation.  CARDIAC:  She has irregular rate and rhythm.  ABDOMEN:  Nontender.  She has normal pitched bowel sounds.   She has some ecchymosis on the left groin. She has palpable femoral,  popliteal, dorsalis pedis and posterior tibial pulses bilaterally. She has  no significant lower extremity swelling. I reviewed her CAT scan which shows  a small to moderate retroperitoneal hematoma on the left side. I have also  reviewed her duplex which shows a very small pseudoaneurysm of the common  femoral artery which was somewhat difficult to visualize because it was  fairly high up  the common femoral artery. There was a slight bruit  suggesting possibly an AV fistula.   ASSESSMENT:  1.  With respect to her retroperitoneal hematoma, since she has received the      2 units of blood, her hemoglobin has been stable and there is no      evidence of active bleeding. I would consider a follow-up CT scan of the      abdomen and pelvis early next week especially if she has continued      fevers. Currently, I do not see any indication for surgical exploration      at this point.  I would agree with continuing to check her CBCs daily      for now and being careful with her anticoagulation.  2.  With respect to her left common femoral artery pseudoaneurysm and      possible AV fistula. This is very small by the duplex that I reviewed.      The bruit was subtle. I think this will likely resolve on its own and      again at this point I would not recommend exploration or repair. The      primary risk associated with this would be a 15% risk of wound healing      problems. Because of its location and the fact that it is difficult to      visualize, she is really not a candidate for compression or thrombin      injection as it is fairly high and difficult to visualize. I will follow      her for now and suspect these issues will resolve without intervention.      CSD/MEDQ  D:  07/24/2004  T:  07/24/2004  Job:  [id number]

## 2010-07-27 NOTE — H&P (Signed)
NAME:  Angela, [name] NO.:  [id number]   MEDICAL RECORD NO.:  [id number]          PATIENT TYPE:  INP   LOCATION:                               FACILITY:  [facility]   PHYSICIAN:  Pricilla Riffle, M.D.    DATE OF BIRTH:  1925/12/10   DATE OF ADMISSION:  04/30/2004  DATE OF DISCHARGE:                                HISTORY & PHYSICAL   IDENTIFICATION:  Patient is a 77 year old with known coronary artery disease  who comes in for evaluation of chest pain.   HISTORY OF PRESENT ILLNESS:  Patient has a history of coronary artery  disease.  She had an abnormal Cardiolite and underwent catheterization on  January 30.  Left main was normal.  LAD had a proximal 70% stenosis before a  large diagonal.  There was a 99% stenosis after this diagonal.  Distal  vessel had a 50% stenosis.  Mid vessel was noted to be narrow.  First  diagonal very large.  Circumflex was normal, had a ramus intermedius.  RCA  very large, dominant vessel that had a long 25% proximal stenosis.  EF was  60%.  Films were reviewed and plan was for medical therapy.   The patient since she has been at home has been taking it easy, takes things  at her own pace, very slowly.  Her biggest complaint until today was right  groin pain that has gradually increased.   This morning she was sitting in the chair reading the paper when she began  to develop substernal chest pressure like elephants sitting on me.  Began  about 10:30-11:00 a.m.  Rated as an 8/10 in intensity.  Associated with some  shortness of breath.  Pain also in right arm.  Pain decreased to 2-3, but  never completely resolved until now.   Patient again fatigues easy.  Still complaining of right pain.  Gets short  of breath easily.   ALLERGIES:  None.   MEDICATIONS:  1.  Lotrel 10/20.  2.  Aspirin 325.  3.  Metoprolol 50 b.i.d.  4.  Crestor 10.   PAST MEDICAL HISTORY:  1.  Coronary artery disease.  2.  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  3.  Hyperlipidemia.   SOCIAL HISTORY:  The patient lives in [place].  Does not smoke.  Drinks  rarely.   FAMILY HISTORY:  Mother had coronary artery problems.  Father cirrhosis.  Brother died of an Mi in his 3s.  One brother with an MI, is alive.   REVIEW OF SYSTEMS:  All systems reviewed.  Negative to the above problem  except as noted above.   PHYSICAL EXAMINATION:  GENERAL:  Currently, the patient denies chest pain.  VITAL SIGNS:  Blood pressure 159/66, pulse 61, temperature 98.7.  HEENT:  Normocephalic, atraumatic.  PERRL.  NECK:  JVP is normal.  No bruits.  CARDIAC:  Regular rate and rhythm.  S1, S2.  No S3, S4, or murmurs.  LUNGS:  Clear to auscultation.  ABDOMEN:  Supple.  EXTREMITIES:  Right groin area and thigh are  very tender.  No bruit noted.  Note small varicosity in the medial thigh.  Distal pulses are intact 2+.  Strength in the right foot is 5/5.   Chest x-ray question middle interstitial changes.   12-lead EKG shows sinus bradycardia, rate of 56 beats per minute.   LABORATORIES:  Significant for a hemoglobin of 12.0, WBC 6.9.  BUN and  creatinine of 8 and 1, potassium of 3.9.  Initial troponin less than 0.05.   IMPRESSION:  1.  Chest pain.  Probable angina with recent catheterization with      significant disease.  Plan for medical therapy.  Will need to re-review      images.  Continue on current medicines and add Lovenox if pain recurs.      Integrilin, nitroglycerin to decrease blood pressure.  2.  Right thigh pain.  Will plan on ultrasound (venous arterial) of the      right thigh.  Again, plan Lovenox.       ___________________________________________  Pricilla Riffle, M.D.    PVR/MEDQ  D:  04/30/2004  T:  04/30/2004  Job:  [id number]

## 2010-07-27 NOTE — Op Note (Signed)
[facility]  Patient:    Angela [name], Angela [name] Visit Number: [id number] MRN: [id number]          Service Type: DSU Location: [facility] Attending Physician:  Randolm Idol Dictated by:   Claude Manges. Cleophas Dunker, M.D. Proc. Date: 11/20/00 Admit Date:  11/20/2000                             Operative Report  PREOPERATIVE DIAGNOSIS: 1. Impingement, right shoulder. 2. Degenerative joint disease, acromioclavicular joint.  POSTOPERATIVE DIAGNOSIS: 1. Impingement, right shoulder. 2. Degenerative joint disease, acromioclavicular joint.  OPERATION PERFORMED: 1. Arthroscopic debridement, right shoulder joint. 2. Arthroscopic subacromial decompression. 3. Arthroscopic distal clavicle resection.  SURGEON:  Claude Manges. Cleophas Dunker, M.D.  ANESTHESIA:  General orotracheal.  COMPLICATIONS:  None.  INDICATIONS FOR PROCEDURE:  The patient is a 77 year old female has had throughout with her right shoulder for well over a year. I have seen her for many months with treatment including physical therapy, cortisone injections, anti-inflammatory medicines only to have persistent trouble with her shoulder. She has had an MRI scan that reveals degenerative changes of the acromioclavicular joint with probable impingement, a chronic supra and infraspinatus tendinosis and a type 2 acromion.  Clinically, she has evidence of impingement syndrome without evidence of a rotator cuff tear.  She is now to have arthroscopic evaluation and decompression.  DESCRIPTION OF PROCEDURE:  With the patient comfortable on the operating table, and under general orotracheal anesthesia, the patient was placed in the semisitting position with the shoulder frame.  The right shoulder was then prepped circumferentially from the base of the neck to below the elbow with DuraPrep.  Sterile draping was performed.  A marking pen was used to outline the acromion, the [facility] joint and the coracoid at a  point a fingerbreadth inferior and medial to the posterior angle of the acromion, a small stab wound was made prior to which point 0.25% Marcaine with epinephrine was injected.  Arthroscope evaluation revealed an intact biceps tendon, the labrum was frayed anteriorly.  There was mild synovitis.  I did not see any appreciable chondromalacia and I carefully checked the rotator cuff and did not see any evidence of a tear.  A second portal was established anteriorly.  Shaving was performed of the anterior glenoid labrum and synovial tissue.  The arthroscope was then placed in the subacromial space posteriorly, the cannula anteriorly and a third portal was established in the lateral subacromial space.  An arthroscopic subacromial decompression was was performed.  There was a moderate amount of bursal tissue with some bleeding and at times visualization was difficult but I thought I had an excellent  anterior decompression. There was spurring particularly in the area of the anterior acromion impinging the supraspinatus. I did not see any evidence of a rotator cuff tear through the bursal surface.  A distal clavicle resection was performed through the arthroscope with a nice decompression probably 7 or 8 mm the best I could tell.  There were degenerative changes at the joint with evidence of impingement.  The three stab wounds were then infiltrated with 0.25% Marcaine with epinephrine.  The anterior and the lateral stab wounds were closed with 4-0 Ethilon.  Sterile bulky dressing was applied  followed by a sling.  The patient tolerated the procedure without complications.  PLAN:  [facility], Vicodin for pain, office 10 days. Dictated by:   Claude Manges. Cleophas Dunker, M.D. Attending Physician:  Randolm Idol DD:  11/20/00 TD:  11/20/00 Job: [id number]

## 2010-07-27 NOTE — Cardiovascular Report (Signed)
NAMEMarland [name]  Angela, REMINGTON NO.:  [id number]   MEDICAL RECORD NO.:  [id number]          PATIENT TYPE:  INP   LOCATION:  6526                         FACILITY:  [facility]   PHYSICIAN:  Carole Binning, M.D. LHCDATE OF BIRTH:  October 24, 1925   DATE OF PROCEDURE:  05/02/2004  DATE OF DISCHARGE:                              CARDIAC CATHETERIZATION   PROCEDURE PERFORMED:  1.  PTCA with placement of a bare-metal stent in the mid left anterior      descending artery.  2.  PTCA of second diagonal branch.  3.  PTCA with placement of a drug-eluting stent in the proximal left      anterior descending artery.   INDICATIONS:  Ms. Morell is a 77 year old woman with history of chest  pain. Cardiac catheterization performed approximately two weeks ago revealed  very complex disease in left anterior descending artery with a diffuse 75%  stenosis in the proximal LAD. In the mid LAD there is a 99% stenosis just  after the bifurcation of a large second diagonal branch. The second diagonal  branch itself had a 60% stenosis at its ostium. The LAD beyond the 99%  stenosis was small caliber being approximately 2 mm in diameter with  moderate diffuse disease in the distal vessel. The initial recommendation  was made for medical therapy. However, the patient represented to the  hospital with recurrent chest pain compatible with unstable angina. After  review of the images we opted to proceed with percutaneous coronary  intervention.   PROCEDURE NOTE:  A 6-French sheath was placed in the left femoral artery.  Angiomax was administered per protocol. We used a 6-French JL-4 guiding  catheter. An Asahi soft coronary guidewire was advanced under fluoroscopic  guidance into the distal aspect of the second diagonal branch. The second  Asahi soft wire was then advanced under fluoroscopic guidance into the  distal LAD. We then performed PTCA of the mid LAD with a 2.0 x 20-mm  Maverick balloon. We  performed two inflations to 8 atmospheres, one for 2  minutes and the second for 3 minutes. Angiographic images at that point  revealed improvement, however, there was haziness and moderate dissection at  the angioplasty site. We therefore felt that it was necessary to place a  stent. We carefully positioned a 2.0 x 15 mm mini-Vision  stent in the mid  LAD. We carefully positioned the stent just beyond the origin of the second  diagonal branch. The stent was deployed at 8 atmospheres. This resulted in  some plaque shifting into the diagonal branch. We went back with a 2.0 x 20-  mm Quantum balloon within the stent in the mid-LAD  and inflated it to 14  atmospheres. We then went in with a 2.0 x 20-mm Maverick balloon in the  second diagonal branch and inflated it to 12 atmospheres. We then performed  kissing balloon angioplasty with the Quantum balloon in the left anterior  descending artery stent and the Maverick balloon in the diagonal branch.  Each balloon was inflated to 10 atmospheres. Angiographic images of that  point revealed an excellent result with 0%  residual stenosis at the stent  site and TIMI III flow into the distal vessel. In the diagonal branch  itself, there is a 20% stenosis with TIMI III flow.   Following this, we removed our wire from the second diagonal branch. We  positioned a 2.5 x 18 mm Cypher  drug-eluting stent across the diseased  segment of vessel in the proximal left anterior descending artery. The stent  was deployed at 10 atmospheres. We then went back with a 2.5 x 15-mm Quantum  balloon within the Cypher stent and inflated it to 15 atmospheres in the  distal aspect of stent and 20 atmospheres in the proximal aspect of the  stent. Finally we went in with a 3.0 x 8 mm Quantum balloon in the very  proximal aspect of the Cypher stent and inflated it to 18 atmospheres. We  then inflated this balloon to 8 atmospheres in the midportion of stent.  Multiple doses of  intracoronary nitroglycerin were administered during the  procedure. Final angiographic images were obtained providing patency of the  left anterior descending artery with 0% residual stenosis in the proximal  stent site and TIMI III flow into the distal vessel.   At the conclusion of procedure, AngioSeal vascular closure device was placed  in the right femoral artery with good hemostasis.   COMPLICATIONS:  None.   RESULTS:  1.  Successful PTCA with placement of a bare-metal stent in the mid left      anterior descending artery. A complex 99% stenosis was reduced to 0%      residual with TIMI III flow.  2.  Successful PTCA of the second diagonal branch using the kissing balloon      technique. A 60% stenosis was reduced to 20% residual with TIMI III      flow.  3.  Successful placement of a drug-eluting stent in the proximal left      anterior descending artery. A 75% stenosis was reduced to 0% residual      with TIMI III flow.   PLAN:  Angiomax will be discontinued. The patient will be treated with  Plavix for recommended one year. Would also recommend lifelong aspirin  therapy.      MWP/MEDQ  D:  05/02/2004  T:  05/03/2004  Job:  [id number]   cc:   Angela [name], M.D.  [address] [place]  [place] [address]  Fax: [phone]   Angela [name], M.D. [facility]

## 2010-07-27 NOTE — Discharge Summary (Signed)
Angela [name], Angela [name] NO.:  [id number]   MEDICAL RECORD NO.:  [id number]          PATIENT TYPE:  INP   LOCATION:  2036                         FACILITY:  [facility]   PHYSICIAN:  Rollene Rotunda, M.D.   DATE OF BIRTH:  Jul 18, 1925   DATE OF ADMISSION:  07/18/2004  DATE OF DISCHARGE:  07/28/2004                                 DISCHARGE SUMMARY   DISCHARGING DIAGNOSES:  1.  Non-ST elevated myocardial infarction, status post cardiac      catheterization, with a percutaneous coronary intervention to the left      anterior descending.  2.  Status post cardiac catheterization, retroperitoneal bleed/arteriovenous      fistula, small pseudoaneurysm per cardiovascular thoracic surgery,      stable.  The patient will need a computed tomography or ultrasound as      outpatient if no improvement or bruit worsens.  3.  Fever, low-grade temperature.  Pending follow-up blood cultures.  4.  Urinary tract infection, treated.  5.  Hypertension.  Toprol increased to 50 mg p.o. daily.  Lotrel resumed.  6.  Dyslipidemia.  Continue Crestor.  7.  Anemia.  Hemoglobin at discharge 10.4, with a hematocrit of 30.8,      platelet count 305,000.  The patient will need a repeat complete blood      count at follow-up office visit.   DISPOSITION:  The patient is being discharged home, with:  1.  Aspirin 325 mg daily.  2.  Plavix 75 mg daily.  3.  Crestor 10 mg daily.  4.  Toprol XL 50 mg daily.  5.  Ferrous sulfated 325 mg t.i.d.  6.  Multivitamin daily.  7.  Lotrel 5/10 mg daily.  8.  Nitroglycerin as needed for chest discomfort.  9.  Patient instructed to discontinue her Imdur and Lopressor at this time.   ACTIVITY:  No driving for five days.  No lifting for two weeks.  Increase  activity slowly.  May shower.   The patient is instructed to call our office for any fever, any pain, or  increased swelling from catheterization site.  She has a follow-up  appointment with Dr. Jens Som for  08/10/2004 at 9:15.   PROCEDURES THIS ADMISSION:  1.  Cardiac catheterization on Jul 19, 2004.  2.  Ultrasound of the abdomen, Jul 20, 2004.  No evidence of hematoma.  3.  CT of the pelvis on Jul 20, 2004.  Left groin, retroperitoneal, and      perineal hematoma.  4.  CT of the abdomen, Jul 21, 2004.  Increase in retroperitoneal      hemorrhage.  May refer perinaneurysmal hemorrhage.   HOSPITAL COURSE:  1.  Angela [name] is a 77 year old Caucasian female with past medical      history significant for hyperlipidemia, CVA x 2, CAD, unstable angina,      status post PTCA in February 2006, hyperlipidemia, recently hospitalized      in February 2006 with chest complaints.  Cardiac catheterization at that      time showing significant lesion of the LAD and diagonal.  The patient  had drug-eluting stent put in the proximal LAD, bare metal stent to the      mid-LAD.  A second diagonal.  EF at that time of 60%.  At this      admission, the patient presented with complaints of sudden onset of      substernal chest pain.  Patient for cardiac catheterization.  Patient to      the catheterization lab on Jul 19, 2004.  Results as stated above.      Patient tolerated the procedure without complaints; however,      postcatheterization developed a hematoma at catheterization site.  Had      episodes of vagal responses.  Patient monitored closely, treated with IV      fluids.  Hemoglobin drifted down to 7.6 on Jul 21, 2004.  The patient      received packed RBCs and with low-grade temperature.  Urinalysis      positive.  Treated with Cipro.  Cultures pending.  CVTS consulted on Jul 24, 2004 secondary to complications postcatheterization, retroperitoneal      hematoma, small pseudoaneurysm, questionable small AVF.  CVTS said this      would likely resolve on its own.  Did not recommend exploration or      repair at this point.  Patient not a candidate for compression or      thrombin injection,  as questionable fistula is high and difficult to      visualize.  Patient with improvement to left groin, discomfort      decreasing.  2.  Activity.  Patient's activity level increased.  Cardiac rehab in to      ambulate patient.  Tolerated without further complications.  Patient      being discharged home after being seen by Dr. Antoine Poche.  3.  Coronary artery disease.  Patient stable.  4.  Left groin bleed/fistula.  CT or ultrasound as outpatient if not      improved or worsens.  5.  Low-grade temperature.  Dr. Antoine Poche discussed with daughter, who will      follow the patient's temperature at home.  Will notify us if the      patient's temperature increases again.  6.  Anemia.  Hemoglobin stable.  Will recheck CBC at office appointment on      August 10, 2004.      MB/MEDQ  D:  07/28/2004  T:  07/29/2004  Job:  [id number]   cc:   Olga Millers, M.D. [facility]

## 2010-07-27 NOTE — H&P (Signed)
NAMEMarland [name]  SWAY, GUTTIERREZ           ACCOUNT NO.:  [id number]   MEDICAL RECORD NO.:  [id number]          PATIENT TYPE:  INP   LOCATION:  4739                         FACILITY:  [facility]   PHYSICIAN:  Salvadore Farber, M.D. LHCDATE OF BIRTH:  [birth year]   DATE OF ADMISSION:  07/18/2004  DATE OF DISCHARGE:                                HISTORY & PHYSICAL   CHIEF COMPLAINT:  Chest pain.   HISTORY OF PRESENT ILLNESS:  Ms. Angela [name] is a 77 year old white female  with a past medical history significant for hyperlipidemia, CVA x 2 with  residual hemiparesis 2002, history of coronary artery disease, unstable  angina, status post PTCA in February 2006, hyperlipidemia, who was recently  hospitalized in February 2006 with complaints of chest pain and had a  cardiac cath at that time showing significant disease in the LAD and  diagonal, had drug-eluting stent put in the proximal LAD with 0% residual  stenosis and bare metal stent to mid LAD and to second diagonal, EF of 60%,  presents today with complaints of sudden onset of severe substernal chest  pain which started around 8:30 a.m. this morning when she was sitting in a  chair, 10/10 in intensity, felt more like pressure and tightness in the mid  chest radiating to the left side of the chest and left arm.  She took a  tablet of nitroglycerin which partially relieved the pain only to come back  again and this time it was not relieved by the nitroglycerin.  She also had  shortness of breath and felt nauseated at the time but denied any history of  vomiting or diaphoresis.  By the time she presented to the emergency  department her pain was almost down to a 1/10 intensity.  After coming to  the emergency department, she also started having right groin pain similar  to the one she had back in February when she was admitted with chest pain.  She says the groin pain resolved after she had stents put in last time, and  she never had it after discharge  from the hospital until now.  She denies  any history of PND, although she has dyspnea on exertion.   PAST MEDICAL HISTORY:  1.  Coronary artery disease.  2.  History of unstable angina, status post angioplasty, drug-eluting stent      in proximal LAD, bare metal stent to the mid LAD, and PTCA of second      diagonal branch using kissing balloon technique.  3.  History of hypertension.  4.  Hyperlipidemia.  5.  History of CVA, left basal ganglion x 2 with residual right sided      hemiparesis.   HOME MEDICATIONS:  1.  Lotrel 10/20.  2.  Metoprolol 50 b.i.d.  3.  Crestor 10 daily.  4.  Isosorbide mononitrate 30 daily.  5.  Aspirin 325 mg daily.  6.  Plavix 75 mg daily.  7.  Nitroglycerin 0.4 mg p.r.n.   SOCIAL HISTORY:  The patient lives in [place] independently.  Her  daughter stays nearby.  Denies any history of tobacco abuse or alcohol  intake.   FAMILY HISTORY:  Mother had coronary artery problems.  Father has a history  of cirrhosis.  Brother died of MI in his 40s.  One brother with MI alive.   REVIEW OF OTHER SYSTEMS:  According to the present history.  Denied any  history of fever, urinary problems, diarrhea, dysphagia.   PHYSICAL EXAMINATION:  VITAL SIGNS:  Show a temperature of 97.7, pulse 70,  respiratory rate 18, blood pressure 150/70, SpO2 98% on 2 liters.  GENERAL:  She was in no acute distress, lying down in bed.  HEENT:  Normocephalic, atraumatic.  PERRLA.  Extraocular muscles intact.  NECK:  No JVD.  No thyromegaly.  No carotid bruits appreciated.  CVS:  Regular rate and rhythm.  S1 S2 plus .  No murmurs.  LUNGS:  Clear to auscultation bilaterally.  ABDOMEN:  Supple.  EXTREMITIES:  With 1+ bilateral pedal edema, 2+ pulse bilaterally.  Right  groin area and thigh very tender.  No local signs of inflammation.  Small  varicosities in the mid thigh.   A 12-lead EKG showed normal sinus rhythm,  axis normal, intervals within  normal limits, no significant  changes from previous EKG.  Chest x-ray showed  cardiomegaly with pulmonary vascular congestion, mild chronic interstitial  changes, and COPD, small left pleural effusion.   ADMISSION LABS:  CBC:  Hemoglobin 11.8, WBC count 6.4, platelet count 177.  B-MET:  Sodium 139, potassium 3.5, chloride 110, bicarb 22, BUN 7,  creatinine 0.8, glucose 123.  AST 30, ALT 16, total bili 0.8, albumin 3.2.  PT 12.5, INR 0.9.  PH 7.473.  Point-of-care markers:  CK-MB, troponin I,  myoglobin x 3 negative.   IMPRESSION:  1.  Chest pain, atypical most likely noncardiac in origin given recent      cardiac catheterization and stent placement with a 0% residual disease      in the left anterior descending artery but still given her past medical      history and multiple risk factors, our concerned about unstable angina.      We will cycle cardiac enzymes to rule out myocardial infarction, also      start her on nitroglycerin drip and heparin drip, continue her on      aspirin, Plavix, and on beta-blocker.  We will plan to schedule her for      a cardiac catheterization tomorrow, Jul 19, 2004, to rule out ischemic      etiology and to assess the patency of the stents, also continue Statin.      We will treat her with Protonix for possible gastroesophageal reflux      disease which could also cause atypical chest pain.  Check her D-dimmer      to rule out pulmonary embolism.  Check a BNP.  2.  Right groin pain.  Had a recent CT scan done with similar complaints in      February 2006 which did not show any abscess or hematoma at the time.      For this reason, we will put her on pain medication and watch her      carefully.  Since there are no local signs of inflammation, unlikely      cellulitis as inflammation.  For now treatment symptomatically for pain.      SY/MEDQ  D:  07/18/2004  T:  07/18/2004  Job:  [id number]   cc:   Olga Millers, M.D. [facility]

## 2010-08-07 ENCOUNTER — Telehealth: Payer: Self-pay | Admitting: *Deleted

## 2010-08-07 NOTE — Telephone Encounter (Signed)
Angela [name], she understands.  Appt made for 5/31 with Dr. Para March.

## 2010-08-07 NOTE — Telephone Encounter (Signed)
Pt needs OV.  .  If actively suicidal in meantime, then she needs to be at ER.  Please verify that she isn't suicidal or (if she still is) that she is on the way to the ER.  Thanks.

## 2010-08-07 NOTE — Telephone Encounter (Signed)
Nurse at [facility] called to report that pt has said she should just commit suicide.  Pt has dementia and her daughter thinks she is trying to get attention, but because pt has mentioned this before the nurse thought she should call and let you know.  She said they do have a psyche service there at the facility that offers med management and counseling, but because of pt's dementia, nurse doesn't think she would benefit from counseling.  Nurse asks if you would want to see the patient here and perhaps prescribe an antidepressant.

## 2010-08-09 ENCOUNTER — Encounter: Payer: Self-pay | Admitting: Family Medicine

## 2010-08-09 ENCOUNTER — Ambulatory Visit (INDEPENDENT_AMBULATORY_CARE_PROVIDER_SITE_OTHER): Payer: Medicare Other | Admitting: Family Medicine

## 2010-08-09 DIAGNOSIS — F329 Major depressive disorder, single episode, unspecified: Secondary | ICD-10-CM | POA: Insufficient documentation

## 2010-08-09 MED ORDER — MIRTAZAPINE 15 MG PO TABS: 15.0000 mg | ORAL_TABLET | Freq: Every day | ORAL | Status: DC

## 2010-08-09 NOTE — Patient Instructions (Signed)
Let me know if you have any trouble on the medicine in the meantime.  I'll see you 09/10/10, sooner if needed.  Take care. Start taking 15mg  of remeron/mirtazapine each night.

## 2010-08-09 NOTE — Progress Notes (Signed)
Mood changes noted by family over last few months.  Irritable, sleep changes, depressed mood.  H/o vague statements about "wishing I was gone", but no h/o SI/HI.  No h/o SA.  Dec in appetite.  Memory changes persist.  Known dementia.  No FCNAVD.  No rash, productive cough.  She admits to feeling lonely and memory changes.   Meds, vitals, and allergies reviewed.   ROS: See HPI.  Otherwise, noncontributory.  nad ncat Mmm rrr ctab ext with trace edema Alert, pleasant, unsure of year but knows day, month.  0/3 recall, able to follow commands and spell earth backward.

## 2010-08-09 NOTE — Assessment & Plan Note (Signed)
Complicated by dementia. Depressed mood present for weeks w/o acute change. D/w pt and family.  Will start remeron to help with mood, sleep, and appetite.  Fu scheduled, will have family call back sooner prn.  I think that this mood change is related to progression of dementia.  >25 min spent with face to face with patient >50% counseling.

## 2010-08-10 ENCOUNTER — Telehealth: Payer: Self-pay | Admitting: *Deleted

## 2010-08-10 NOTE — Telephone Encounter (Signed)
Please fax this signed copy over.

## 2010-08-10 NOTE — Telephone Encounter (Signed)
Faxed order over as directed. The correct fax # is [phone]. I entered it wrong in the note, but faxed it to the correct location.

## 2010-08-10 NOTE — Telephone Encounter (Signed)
Pt has started taking remeron and she is concerned that she slept till 10 this morning, normally she is up around 6. She seems confused about why she overslept and Tammy is asking if her dose needs to be decreased.  She is taking 15 mg's now, took her first one last night.

## 2010-08-10 NOTE — Telephone Encounter (Signed)
Notified Tammy at [facility]. She will need a written order faxed to her attention at [phone]. She is not able to take verbal orders.

## 2010-08-10 NOTE — Telephone Encounter (Signed)
Please have her dec to 7.5 mg at night of the remeron, adjust the med list, and then have them call back with update. Thanks.

## 2010-08-28 ENCOUNTER — Encounter: Payer: Self-pay | Admitting: Cardiovascular Disease

## 2010-09-10 ENCOUNTER — Encounter: Payer: Self-pay | Admitting: Family Medicine

## 2010-09-10 ENCOUNTER — Ambulatory Visit (INDEPENDENT_AMBULATORY_CARE_PROVIDER_SITE_OTHER): Payer: Medicare Other | Admitting: Family Medicine

## 2010-09-10 ENCOUNTER — Telehealth: Payer: Self-pay | Admitting: *Deleted

## 2010-09-10 VITALS — BP 160/68 | HR 64 | Temp 98.4°F | Wt 167.8 lb

## 2010-09-10 DIAGNOSIS — F329 Major depressive disorder, single episode, unspecified: Secondary | ICD-10-CM

## 2010-09-10 DIAGNOSIS — I2510 Atherosclerotic heart disease of native coronary artery without angina pectoris: Secondary | ICD-10-CM | POA: Insufficient documentation

## 2010-09-10 LAB — COMPREHENSIVE METABOLIC PANEL
AST: 21 U/L (ref 0–37)
BUN: 22 mg/dL (ref 6–23)
CO2: 28 mEq/L (ref 19–32)
Calcium: 9.6 mg/dL (ref 8.4–10.5)
Chloride: 105 mEq/L (ref 96–112)
Creatinine, Ser: 1.3 mg/dL — ABNORMAL HIGH (ref 0.4–1.2)
GFR: 43.46 mL/min — ABNORMAL LOW (ref >60.00)
Glucose, Bld: 93 mg/dL (ref 70–99)

## 2010-09-10 LAB — LIPID PANEL
Cholesterol: 128 mg/dL (ref 0–200)
HDL: 49.5 mg/dL (ref >39.00)
Triglycerides: 166 mg/dL — ABNORMAL HIGH (ref 0.0–149.0)

## 2010-09-10 MED ORDER — MIRTAZAPINE 15 MG PO TABS: ORAL_TABLET | ORAL | Status: DC

## 2010-09-10 NOTE — Progress Notes (Signed)
Addended by: Lars Mage on: 09/10/2010 11:50 PM   Modules accepted: Orders

## 2010-09-10 NOTE — Progress Notes (Signed)
Memory changes and depression. Last OV note reviewed.  Now sleep is improved, no dec in weight and mood is improved.  Not wandering at night and she has been apparently happier recently.    She is due for labs.  Family asking about meds and expenses.  I told them that this was a case where I could give a 'best' answer and mult variables need to be considered.  We agreed to check labs today and then see what could be done with the zetia and the plavix.   Pt states she is feeling well o/w and denies pain or other complaints.   She is not oriented to her age but she is oriented to self.   Meds, vitals, and allergies reviewed.   ROS: See HPI.  Otherwise, noncontributory.  nad ncat Mmm rrr ctab Ext with trace edema, skin well perfused. Gait at baseline with walker.

## 2010-09-10 NOTE — Telephone Encounter (Signed)
Tammy is asking if they can get an order for a UA. She says that patient has a lot of behavioral changes in the past couple of days and she is worried that she may have a uti. Their fax # is [phone].

## 2010-09-10 NOTE — Telephone Encounter (Signed)
Spoke to [place] because Tammy was not there. Quentin Angst advised as instructed by telephone. Was informed by [place] that she will write up the order and fax it back for Dr. Para March to sign.

## 2010-09-10 NOTE — Patient Instructions (Signed)
You can get your results through our phone system.  Follow the instructions on the blue card. I'll be thinking about your meds in the meantime.  Take care.

## 2010-09-10 NOTE — Telephone Encounter (Signed)
I was not told about any of this at OV this AM.  Please get ua and ucx with dx AMS.  Thanks.

## 2010-09-10 NOTE — Assessment & Plan Note (Signed)
Plan for medical mgmt.  No CP now.  I'll check the labs and then present the family with options about the zetia and/or plavix.  No change in meds today.  Labs are nonfasting.

## 2010-09-10 NOTE — Assessment & Plan Note (Addendum)
Appears improved.  "I'm not depressed."  No alarming behaviors and appears to be doing well.  No change in meds today.  >25 min spent with face to face with patient, >50% counseling pt/family about dementia/depression and overlap of sx.

## 2010-09-10 NOTE — Telephone Encounter (Signed)
Noted  

## 2010-09-14 ENCOUNTER — Telehealth: Payer: Self-pay | Admitting: *Deleted

## 2010-09-14 NOTE — Telephone Encounter (Signed)
Message copied by Jobie Quaker on Fri Sep 14, 2010  3:31 PM ------      Message from: Lars Mage      Created: Mon Sep 10, 2010 11:49 PM       Please notify pt's family.  Her labs are okay for now.  I would stop the zetia since her LDL is so low.  Thanks.

## 2010-09-14 NOTE — Telephone Encounter (Signed)
Please stop the zetia.  Please send this note to [facility].

## 2010-09-14 NOTE — Telephone Encounter (Signed)
Patient's daughter notified. She says that an order to stop the zetia will need to be faxed to [facility].

## 2010-09-14 NOTE — Telephone Encounter (Signed)
Note sent to [facility].

## 2010-09-18 ENCOUNTER — Encounter: Payer: Self-pay | Admitting: Family Medicine

## 2010-09-19 ENCOUNTER — Telehealth: Payer: Self-pay | Admitting: *Deleted

## 2010-09-19 NOTE — Telephone Encounter (Signed)
Tammy called to report that pt tripped over her shoe and fell on her left side today at the facility.  Now she has a knot the size of a 50 cent piece on her arm.  She had a bruise about 2 and a half inches long on her arm prior to the fall, the knot is in the same area as the bruise.  Pt is not complaining of any pain with this.

## 2010-09-19 NOTE — Telephone Encounter (Signed)
[employer] notified and will call if patient has any complaints or problems.

## 2010-09-19 NOTE — Telephone Encounter (Signed)
Noted, if she has pain or motor deficits (or other complaints), then she needs eval.  Please relay the message.  Thanks.

## 2010-10-24 ENCOUNTER — Telehealth: Payer: Self-pay | Admitting: *Deleted

## 2010-10-24 NOTE — Telephone Encounter (Signed)
Daughter says that the patient was taken off of her Zetia but [facility] is still charging her for it.  Can we notify [facility] that this should have been discontinued?

## 2010-10-24 NOTE — Telephone Encounter (Signed)
Please notify them that the zetia was d/c'd.  Thanks.

## 2010-10-25 ENCOUNTER — Encounter: Payer: Self-pay | Admitting: *Deleted

## 2010-10-25 NOTE — Telephone Encounter (Signed)
Med Tech at [facility] was advised to discontinue Zetia.  She said that she needed an order faxed to her.  I have printed an order and it is ready for your signature in your in box.  I advised the med tech that it would be Monday before this would be faxed.    FAX:  [phone]. ATT:  Med Tech.

## 2010-10-28 NOTE — Telephone Encounter (Signed)
Please send in °

## 2010-10-29 NOTE — Telephone Encounter (Signed)
Order faxed and sent for scanning.

## 2010-11-06 ENCOUNTER — Other Ambulatory Visit: Payer: Self-pay | Admitting: *Deleted

## 2010-11-07 MED ORDER — MIRTAZAPINE 15 MG PO TABS: ORAL_TABLET | ORAL | Status: DC

## 2010-11-07 NOTE — Telephone Encounter (Signed)
Sent!

## 2010-11-20 ENCOUNTER — Encounter: Payer: Self-pay | Admitting: Family Medicine

## 2010-11-20 ENCOUNTER — Ambulatory Visit (INDEPENDENT_AMBULATORY_CARE_PROVIDER_SITE_OTHER): Payer: Medicare Other | Admitting: Family Medicine

## 2010-11-20 VITALS — BP 140/90 | HR 72 | Temp 98.2°F | Resp 18

## 2010-11-20 DIAGNOSIS — N390 Urinary tract infection, site not specified: Secondary | ICD-10-CM | POA: Insufficient documentation

## 2010-11-20 DIAGNOSIS — R300 Dysuria: Secondary | ICD-10-CM

## 2010-11-20 LAB — POCT URINALYSIS DIPSTICK
Bilirubin, UA: NEGATIVE
Ketones, UA: NEGATIVE
pH, UA: 7

## 2010-11-20 MED ORDER — CIPROFLOXACIN HCL 250 MG PO TABS: 250.0000 mg | ORAL_TABLET | Freq: Two times a day (BID) | ORAL | Status: AC

## 2010-11-20 NOTE — Progress Notes (Signed)
Daughter was called this AM.  Pt was up at night.  Chills and nausea.  She was confused, pt still is unsure of recent events.  H/o dementia.  No known dysuria.  No known fevers.  She had her days and nights mixed up per daughter.  Pt's daughter gives most of the hx.  The pt did have some chest discomfort, but this resolved after burping.    PMH and SH reviewed  ROS: See HPI, otherwise noncontributory.  Meds, vitals, and allergies reviewed.   In wheelchair, occ shaking as if with chills, self resolves during the exam ncat Mmm, no erythema Tm w/o erythema Ctab, no inc in wob rrr Chest wall minimally ttp anteriorly but no rash or focal tenderness abd soft, not ttp, normal bs, no rebound, no suprapubic pain Ext with 1+ edema but normal cap refill.

## 2010-11-20 NOTE — Assessment & Plan Note (Addendum)
No cva pain.  She could have mental status changes from UTI.  I doubt a cardiac cause and I d/w pt's daughter about the plan.  She has a benign lung exam and is able to follow commands.  We agreed to treat presumptively for UTI.  Should she dramatically and acutely worsen, she would need ER eval, o/w can f/u here.  Start cipro, check ux.  U/a reviewed.  >25 min spent with face to face with patient, >50% counseling.

## 2010-11-20 NOTE — Patient Instructions (Addendum)
Start the cipro today.  We'll contact you with your lab report. Drink plenty of fluids.  If you get short of breath or stop making urine, then go to the ER.  Otherwise you can follow up here.

## 2010-11-22 LAB — URINE CULTURE: Organism ID, Bacteria: NO GROWTH

## 2011-01-04 ENCOUNTER — Telehealth: Payer: Self-pay | Admitting: *Deleted

## 2011-01-04 ENCOUNTER — Emergency Department: Payer: Medicare Other | Admitting: *Deleted

## 2011-01-04 NOTE — Telephone Encounter (Signed)
Tammy at [facility] called to report that pt fell this morning.  She had a laceration over her eye and was taken to [facility] ER, where she was given 6 stitches.  She was sent back with orders for otc pain meds.

## 2011-01-04 NOTE — Telephone Encounter (Signed)
Noted  

## 2011-01-11 ENCOUNTER — Emergency Department: Payer: Medicare Other | Admitting: Emergency Medicine

## 2011-02-11 ENCOUNTER — Other Ambulatory Visit: Payer: Self-pay | Admitting: *Deleted

## 2011-02-11 MED ORDER — VITAMIN D 1000 UNITS PO TABS: 1000.0000 [IU] | ORAL_TABLET | Freq: Two times a day (BID) | ORAL | Status: DC

## 2011-02-11 MED ORDER — ROSUVASTATIN CALCIUM 10 MG PO TABS: 10.0000 mg | ORAL_TABLET | Freq: Every day | ORAL | Status: DC

## 2011-02-14 ENCOUNTER — Other Ambulatory Visit: Payer: Self-pay | Admitting: Family Medicine

## 2011-02-14 ENCOUNTER — Other Ambulatory Visit: Payer: Self-pay | Admitting: *Deleted

## 2011-02-14 MED ORDER — LEVETIRACETAM ER 500 MG PO TB24: 500.0000 mg | ORAL_TABLET | Freq: Two times a day (BID) | ORAL | Status: DC

## 2011-02-14 MED ORDER — METOPROLOL TARTRATE 25 MG PO TABS: 25.0000 mg | ORAL_TABLET | Freq: Two times a day (BID) | ORAL | Status: DC

## 2011-02-14 MED ORDER — ONE-DAILY MULTI VITAMINS PO TABS: 1.0000 | ORAL_TABLET | Freq: Every day | ORAL | Status: DC

## 2011-02-14 NOTE — Progress Notes (Signed)
See hard copy about faxing in rx for keppra to [facility].  Thanks.

## 2011-02-15 ENCOUNTER — Telehealth: Payer: Self-pay | Admitting: Internal Medicine

## 2011-02-15 MED ORDER — LEVETIRACETAM ER 500 MG PO TB24: 500.0000 mg | ORAL_TABLET | Freq: Two times a day (BID) | ORAL | Status: DC

## 2011-02-15 NOTE — Telephone Encounter (Signed)
Patient's daughter called Marylu Lund and would like a Letter to excuse Ms. Messineo from [employer] because of her Dementia.

## 2011-02-17 ENCOUNTER — Encounter: Payer: Self-pay | Admitting: Family Medicine

## 2011-02-17 NOTE — Telephone Encounter (Signed)
Letter should be in EMR.  Please print and send in.  Thanks.

## 2011-02-18 NOTE — Telephone Encounter (Signed)
Printed and daughter called for pick up.

## 2011-02-26 ENCOUNTER — Other Ambulatory Visit: Payer: Self-pay | Admitting: *Deleted

## 2011-02-26 MED ORDER — LISINOPRIL 40 MG PO TABS: 40.0000 mg | ORAL_TABLET | Freq: Every day | ORAL | Status: DC

## 2011-02-26 MED ORDER — AMLODIPINE BESYLATE 5 MG PO TABS: 5.0000 mg | ORAL_TABLET | Freq: Every day | ORAL | Status: DC

## 2011-03-15 ENCOUNTER — Other Ambulatory Visit: Payer: Self-pay | Admitting: *Deleted

## 2011-03-15 MED ORDER — CLOPIDOGREL BISULFATE 75 MG PO TABS: 75.0000 mg | ORAL_TABLET | Freq: Every day | ORAL | Status: DC

## 2011-03-22 ENCOUNTER — Other Ambulatory Visit: Payer: Self-pay | Admitting: *Deleted

## 2011-03-22 MED ORDER — LEVETIRACETAM ER 500 MG PO TB24: 500.0000 mg | ORAL_TABLET | Freq: Two times a day (BID) | ORAL | Status: DC

## 2011-05-01 ENCOUNTER — Observation Stay: Payer: Self-pay | Admitting: Internal Medicine

## 2011-05-01 ENCOUNTER — Encounter: Payer: Self-pay | Admitting: Cardiovascular Disease

## 2011-05-01 ENCOUNTER — Telehealth: Payer: Self-pay | Admitting: Family Medicine

## 2011-05-01 LAB — BASIC METABOLIC PANEL
Anion Gap: 8 (ref 7–16)
BUN: 20 mg/dL — ABNORMAL HIGH (ref 7–18)
EGFR (African American): 51 — ABNORMAL LOW
EGFR (Non-African Amer.): 42 — ABNORMAL LOW
Glucose: 109 mg/dL — ABNORMAL HIGH (ref 65–99)
Osmolality: 288 (ref 275–301)
Potassium: 4.2 mmol/L (ref 3.5–5.1)

## 2011-05-01 LAB — CBC
HGB: 12.2 g/dL (ref 12.0–16.0)
MCHC: 33.5 g/dL (ref 32.0–36.0)
Platelet: 147 10*3/uL — ABNORMAL LOW (ref 150–440)
RDW: 13.4 % (ref 11.5–14.5)

## 2011-05-01 NOTE — Telephone Encounter (Signed)
Faxed over request to [facility] for records.

## 2011-05-01 NOTE — Telephone Encounter (Signed)
Patient was sent to [facility] today @ Noon with chest pain.

## 2011-05-01 NOTE — Telephone Encounter (Signed)
Noted, please get records. Thanks.

## 2011-05-02 DIAGNOSIS — R079 Chest pain, unspecified: Secondary | ICD-10-CM

## 2011-05-02 LAB — BASIC METABOLIC PANEL
BUN: 19 mg/dL — ABNORMAL HIGH (ref 7–18)
Calcium, Total: 9.2 mg/dL (ref 8.5–10.1)
Chloride: 109 mmol/L — ABNORMAL HIGH (ref 98–107)
Co2: 27 mmol/L (ref 21–32)
Creatinine: 1.25 mg/dL (ref 0.60–1.30)
EGFR (African American): 53 — ABNORMAL LOW
Osmolality: 291 (ref 275–301)
Sodium: 145 mmol/L (ref 136–145)

## 2011-05-02 LAB — TROPONIN I
Troponin-I: <0.02 ng/mL
Troponin-I: <0.02 ng/mL

## 2011-05-02 LAB — CK TOTAL AND CKMB (NOT AT ARMC)
CK, Total: 57 U/L (ref 21–215)
CK-MB: <0.5 ng/mL — ABNORMAL LOW (ref 0.5–3.6)
CK-MB: <0.5 ng/mL — ABNORMAL LOW (ref 0.5–3.6)

## 2011-05-02 LAB — LIPID PANEL
Ldl Cholesterol, Calc: 60 mg/dL (ref 0–100)
VLDL Cholesterol, Calc: 29 mg/dL (ref 5–40)

## 2011-05-03 ENCOUNTER — Encounter: Payer: Self-pay | Admitting: Cardiovascular Disease

## 2011-05-03 NOTE — Telephone Encounter (Signed)
Information in your in-box.

## 2011-05-03 NOTE — Telephone Encounter (Signed)
Was sent note asking [place] to re-fax on 05/02/11 because ER note had not been DC'd.  Refaxed requesting info.

## 2011-05-06 ENCOUNTER — Encounter: Payer: Medicare Other | Admitting: Cardiovascular Disease

## 2011-05-06 NOTE — Telephone Encounter (Signed)
I'll review the hard copy.   

## 2011-05-14 ENCOUNTER — Encounter: Payer: Self-pay | Admitting: Nurse Practitioner

## 2011-05-14 ENCOUNTER — Ambulatory Visit (INDEPENDENT_AMBULATORY_CARE_PROVIDER_SITE_OTHER): Payer: Medicare Other | Admitting: Nurse Practitioner

## 2011-05-14 ENCOUNTER — Telehealth: Payer: Self-pay | Admitting: Family Medicine

## 2011-05-14 ENCOUNTER — Encounter: Payer: Self-pay | Admitting: *Deleted

## 2011-05-14 VITALS — BP 167/94 | HR 66 | Ht 63.0 in | Wt 176.1 lb

## 2011-05-14 DIAGNOSIS — R413 Other amnesia: Secondary | ICD-10-CM

## 2011-05-14 DIAGNOSIS — R0789 Other chest pain: Secondary | ICD-10-CM

## 2011-05-14 DIAGNOSIS — R609 Edema, unspecified: Secondary | ICD-10-CM

## 2011-05-14 DIAGNOSIS — E7800 Pure hypercholesterolemia, unspecified: Secondary | ICD-10-CM

## 2011-05-14 DIAGNOSIS — I10 Essential (primary) hypertension: Secondary | ICD-10-CM

## 2011-05-14 DIAGNOSIS — R079 Chest pain, unspecified: Secondary | ICD-10-CM

## 2011-05-14 DIAGNOSIS — E119 Type 2 diabetes mellitus without complications: Secondary | ICD-10-CM

## 2011-05-14 DIAGNOSIS — I2510 Atherosclerotic heart disease of native coronary artery without angina pectoris: Secondary | ICD-10-CM

## 2011-05-14 MED ORDER — METOPROLOL TARTRATE 50 MG PO TABS: 50.0000 mg | ORAL_TABLET | Freq: Two times a day (BID) | ORAL | Status: DC

## 2011-05-14 MED ORDER — AMLODIPINE BESYLATE 10 MG PO TABS: 10.0000 mg | ORAL_TABLET | Freq: Every day | ORAL | Status: DC

## 2011-05-14 NOTE — Progress Notes (Addendum)
Patient Name: Angela [name] Date of Encounter: 05/14/2011  Primary Care Provider:  Crawford Givens, MD, MD Primary Cardiologist:  Concha Se, MD  Patient Profile  78 year old female with prior history of coronary artery disease who was recently admitted to [facility] with chest pain and presents today for followup.  Problem List   Past Medical History  Diagnosis Date  . Diabetes mellitus 06/2005    Type II  . Hypertension 1960  . Hyperlipidemia 02/2004  . Memory loss     history of dementia  . CVA (cerebral infarction)     x 3  . Stroke 2001  . Echocardiogram abnormal     a. 09/2003 EF 55-65%, Mild AR;  b. 05/2008 Echo - EF 66-70%, no RWMA, mid-cavity obliteration @ end systole, DD, mild AI/MR/TR, NL RV  . MRI of brain abnormal 09/15/2003    Old lunar infarct, tight L MVERT ART stenosis 40% RICA  . TIA (transient ischemic attack) 11/2007  . Pneumonia 11/2007  . Abnormal head CT 11/06/2007    Old L hemispheric stroke scattered white matter Dz no acute changes  . Abnormal head MRI 12/08/2007    No acute changes  . Abnormal MRA, head 11/08/2007    Stenosis dist R carotid <50% RICA 75-80% LICA <50%  . Chest pain 10/2008    R/O'd Epig. pain, shaking after Morphine  . Cellulitis 07/2009    RLL  . Macular degeneration   . CAD (coronary artery disease)     a. 04/2004 - PCI LAD - cypher DES to prox LAD, Vision BMS to mid-LAD, PTCA to D1;  b. 07/2004 - cath/PCI : LM-NL, LAD 30m ISR (treated with CBA), 70d, LCX minor irrges, RCA 20p, 30d;  c. 05/2008 Aden MV: EF 72%, no ischemia   Past Surgical History  Procedure Date  . Appendectomy     18 YOA  . Shoulder surgery 2004    Left  . Adenosine myoview 03/08/2004    Mild isch EF 72%  . Cardiac catheterization 04/09/2004    Severe single vessel disease, tx medically  . Cardiac catheterization 05/02/2004    PTCA X 3  . Instent restenosis 5/10 - 07/28/2004    [facility]  . Cardiac catheterization 07/19/2004    EF 65% Instent restenosis PTCA  .  Abdominal ct 07/20/2004    Incr in retroperitoneal hemm  . Adenosine myoview 10/17/2004    Mild isch dist ant wall EF 69%  . Adenosine myoview 09/10/2005    Low risk  . Eeg     Pos left frontal temp focus  . Adenosine cardiolite 09/16/2006    No change, normal  . Carotid duplex, bilateral 11/09/2007    40-60% ICA Stenosis L>R  . Modified barium swallow 11/09/2007    No aspiration  . Technetium myoview 05/11/08    Normal  . Lower extremity US 10/26/08    No DVT  . Cataract     Allergies  Allergies  Allergen Reactions  . Morphine Sulfate     REACTION: shaking    HPI  78 y/o female with the above problem list who was recently hospitalized @ [facility] in Feb 2/2 chest pain.  Pt r/o for MI and cont to have intermittent, reproducible, chest pain and chest wall tenderness - felt to be non-cardiac.  Because of dementia, it's difficult to fully grasp the patient has been feeling since discharge.  She initially said that she has had intermittent chest pain but when questioned regarding the specifics, she  comes back and says that she really hasn't had any chest pain.  At one point, she said she had an episode of chest pain 2 or 3 days ago that lasted about an hour and resolved spontaneously.  She didn't tell the staff at the assisted living facility and therefore was not treated with any nitroglycerin.  When asked if pain is any worse with deep breathing or palpation she says only that she had to hold her chest for about an hour.  She is not very active at the assisted-living but does note that she has chronic dyspnea on exertion.  Her daughter confirms this.  She denies any PND, orthopnea, dizziness, syncope.  She does have chronic mild lower extremity edema and she says sometimes she wears TED hose.  She has no chest pain today.  Home Medications  Prior to Admission medications   Medication Sig Start Date End Date Taking? Authorizing Provider  acetaminophen (TYLENOL) 325 MG tablet Take 650 mg by mouth  every 4 (four) hours as needed.     Yes Historical Provider, MD  aspirin 81 MG tablet Take 81 mg by mouth daily.     Yes Historical Provider, MD  cholecalciferol (VITAMIN D) 1000 UNITS tablet Take 1 tablet (1,000 Units total) by mouth 2 (two) times daily. 02/11/11  Yes Joaquim Nam, MD  clopidogrel (PLAVIX) 75 MG tablet Take 1 tablet (75 mg total) by mouth daily. 03/15/11  Yes Joaquim Nam, MD  docusate sodium (COLACE) 100 MG capsule Take 100 mg by mouth 2 (two) times daily as needed.     Yes Historical Provider, MD  donepezil (ARICEPT) 10 MG tablet Take 1 tablet (10 mg total) by mouth at bedtime. 06/11/10  Yes Joaquim Nam, MD  levETIRAcetam (KEPPRA XR) 500 MG 24 hr tablet Take 1 tablet (500 mg total) by mouth 2 (two) times daily. 03/22/11  Yes Joaquim Nam, MD  lisinopril (PRINIVIL,ZESTRIL) 40 MG tablet Take 1 tablet (40 mg total) by mouth daily. 02/26/11  Yes Joaquim Nam, MD  metoprolol tartrate (LOPRESSOR) 25 MG tablet Take 1 tablet (25 mg total) by mouth 2 (two) times daily. 02/14/11  Yes Ruthe Mannan, MD  mirtazapine (REMERON) 15 MG tablet 1/2 tab po qhs. 11/06/10 11/06/11 Yes Joaquim Nam, MD  Misc. Devices (ROLLER [place]) MISC Use as directed    Yes Historical Provider, MD  Multiple Vitamin (MULTIVITAMIN) tablet Take 1 tablet by mouth daily. 02/14/11  Yes Ruthe Mannan, MD  ranitidine (ZANTAC) 150 MG capsule Take 150 mg by mouth daily.     Yes Historical Provider, MD  rosuvastatin (CRESTOR) 10 MG tablet Take 1 tablet (10 mg total) by mouth daily. 02/11/11  Yes Joaquim Nam, MD  amLODipine (NORVASC) 5 MG tablet Take 1 tablet (5 mg total) by mouth daily. 02/26/11   Joaquim Nam, MD    Family History  Family History  Problem Relation Age of Onset  . Aneurysm Mother   . Alcohol abuse Father   . Learning disabilities Sister   . Heart disease Sister     CAD  . Heart disease Brother   . Alcohol abuse Brother   . Alcohol abuse Brother   . Heart disease Brother     MI x 2     Social History  History   Social History  . Marital Status: Married    Spouse Name: N/A    Number of Children: 4  . Years of Education: N/A   Occupational  History  . 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 Smoking status: Never Smoker   . Smokeless tobacco: Never Used  . Alcohol Use: No  . Drug Use: No  . Sexually Active: Not on file   Other Topics Concern  . Not on file   Social History Narrative   Divorced, remarried and widow 10/06/83 ([place]), lives alone [facility]     Review of Systems General:  No chills, fever, night sweats or weight changes.  Cardiovascular:  Episode of chest pain a few days ago as outlined above.  She has chronic dyspnea on exertion as well as mild lower extremity edema.  No orthopnea, palpitations, paroxysmal nocturnal dyspnea. Dermatological: No rash, lesions/masses Respiratory: No cough, dyspnea Urologic: No hematuria, dysuria Abdominal:   No nausea, vomiting, diarrhea, bright red blood per rectum, melena, or hematemesis Neurologic:  No visual changes, wkns.  She does have dementia with memory loss. All other systems reviewed and are otherwise negative except as noted above.  Physical Exam  Blood pressure 167/94, pulse 66, height 5\' 3"  (1.6 m), weight 176 lb 1.9 oz (79.888 kg).  General: Pleasant, NAD Psych: Normal affect. Neuro: Alert and oriented X 3. Moves all extremities spontaneously. HEENT: Normal  Neck: Supple without bruits or JVD. Lungs:  Resp regular and unlabored, CTA. Heart: RRR no s3, s4.  2/6 systolic murmur heard throughout,  loudest at the right upper sternal border. Abdomen: Soft, non-tender, non-distended, BS + x 4.  Extremities: No clubbing, cyanosis.  She has 1+ bilateral malleolar and pedal edema. DP/PT/Radials 2+ and equal bilaterally.  Accessory Clinical Findings  ECG - regular sinus rhythm, 60, no acute ST/T changes  Assessment & Plan  1.  Chest  pain/CAD:  Patient recently admitted to [facility] with chest pain which was felt to be atypical and reproducible with palpation.  Cardiac enzymes were negative at that time.  Though it's difficult to get a good grasp of her symptoms following discharge, she is fairly clear that she had an episode of chest pain lasting about an hour, 2 or 3 days ago.  I offered a lexican myoview vs med titration for BP and watchful waiting.  Pt and dtr prefer to avoid stress testing at this time, and given the atypical nature of her Ss, I feel this is reasonable.  She'll cont her  Asa, plavix, bb, acei, & statin.  I'm going to titrate her Norvasc to 10mg  daily given her htn.  This may provide an antianginal effect as well.  She will f/u with Dr. Mariah Milling in 4-6 wks at which point we can re-eval her Ss and reconsider stress testing.  Pt advised to inform assisted living staff of any recurrence of chest pain.  2.  HTN:  BP elevated today.  Dtr says that this is a pretty typical pressure for her.  As above, increase Norvasc to 10mg  Daily.  Cont current doses of bb/acei.  3.  HL:  On statin.  Nicolasa Ducking, NP 05/14/2011, 11:56 AM  Addendum: Received call from Dr. Para March who was contacted by [facility].  Pt apparently already on Norvasc 10mg .  That being the case, we agreed that given her HTN, next most reasonable BP med adjustment would be to increase lopressor to 50mg  BID.

## 2011-05-14 NOTE — Telephone Encounter (Signed)
I called and d/w C Berge.  Don't change the amlodipine.  Continue 10mg  a day.  Inc the metoprolol tartrate to 50mg  po bid.  Please send in new rx if needed for facility (#180, 3rf).  Thanks.

## 2011-05-14 NOTE — Telephone Encounter (Signed)
[facility] is calling about pt's meds the generic Novasc. Was told to go up to 10 mg's daily but she is currently on 10 mg's daily. They are wondering if she needs to go higher than 10 mg or stay.

## 2011-05-14 NOTE — Telephone Encounter (Signed)
Tried to phone facility, am only able to get auto-attendant.  Will fax info but please check to be sure this information reached the correct person.

## 2011-05-14 NOTE — Patient Instructions (Signed)
Your physician recommends that you schedule a follow-up appointment in: 4-6 WEEKS WITH  DR. Mariah Milling  Your physician has recommended you make the following change in your medication: INCREASE AMLODIPINE TO 10 MG DAILY, A NEW PRESCRIPTION HAS BEEN SENT IN TODAY FOR YOU

## 2011-05-15 NOTE — Telephone Encounter (Addendum)
Delice Bison notified as instructed by telephone. Was informed by Delice Bison that she did received the fax last night. Was advised that Delice Bison will fax over an order for the medication change for Dr. Para March to sign and fax back.

## 2011-05-15 NOTE — Telephone Encounter (Signed)
Tried again to contact the facility, had to leave message on machine to call back.

## 2011-05-15 NOTE — Telephone Encounter (Signed)
Received written order from [facility] for medication change. Form signed by Dr. Para March and faxed back as instructed.

## 2011-05-16 ENCOUNTER — Ambulatory Visit (INDEPENDENT_AMBULATORY_CARE_PROVIDER_SITE_OTHER): Payer: Medicare Other | Admitting: Family Medicine

## 2011-05-16 ENCOUNTER — Encounter: Payer: Self-pay | Admitting: Family Medicine

## 2011-05-16 DIAGNOSIS — R079 Chest pain, unspecified: Secondary | ICD-10-CM

## 2011-05-16 DIAGNOSIS — I10 Essential (primary) hypertension: Secondary | ICD-10-CM

## 2011-05-16 MED ORDER — ACETAMINOPHEN 325 MG PO TABS: 650.0000 mg | ORAL_TABLET | Freq: Two times a day (BID) | ORAL | Status: DC

## 2011-05-16 NOTE — Patient Instructions (Signed)
Give tylenol 325mg , 2 tab twice a day for 1 week (through 05/22/11).   From 05/23/11 on, give tylenol 325 mg, 2 tab twice a day prn for pain. Note change in metoprolol dose.  Call with questions.

## 2011-05-17 ENCOUNTER — Encounter: Payer: Self-pay | Admitting: Family Medicine

## 2011-05-17 NOTE — Assessment & Plan Note (Signed)
Likely chest wall pain.  Will schedule tylenol for 1 week to see if that makes a difference.  Call back as needed.  PRN tylenol after 1 week.

## 2011-05-17 NOTE — Progress Notes (Signed)
Admission to [facility] with CP, R/o MI complete, CXR neg.  Had f/u with cards with elevated BP in spite of amlodipine 10mg  a day.  Still with episodic CP, chest can be tender to palpation.  She may forget to mention episodes to family, staff at facility.  No recent fall.  Not SOB.  Pain can happen at rest, pain may not occur with activity.   Med list thoroughly discussed with family.  PMH and SH reviewed  ROS: See HPI, otherwise noncontributory.  Meds, vitals, and allergies reviewed.   nad ncat Mmm rrr ctab Ant chest wall ttp w/o rash or bruising abd soft Trace edema in BLE edema

## 2011-05-17 NOTE — Assessment & Plan Note (Signed)
Will increase metoprolol due to elevated BP. Continue other meds.  >25 min spent with face to face with patient, >50% counseling and/or coordinating care.

## 2011-05-27 ENCOUNTER — Telehealth: Payer: Self-pay | Admitting: *Deleted

## 2011-05-27 NOTE — Telephone Encounter (Signed)
Faxed and sent to scan.

## 2011-05-27 NOTE — Telephone Encounter (Signed)
Done, please send in.  Thanks.  

## 2011-05-27 NOTE — Telephone Encounter (Signed)
Orders from nursing home faxed, in your in box.  Please sign and I will fax back.

## 2011-06-10 ENCOUNTER — Other Ambulatory Visit: Payer: Self-pay | Admitting: *Deleted

## 2011-06-10 MED ORDER — METOPROLOL TARTRATE 50 MG PO TABS: 50.0000 mg | ORAL_TABLET | Freq: Two times a day (BID) | ORAL | Status: DC

## 2011-06-10 NOTE — Telephone Encounter (Signed)
Received faxed refill request from pharmacy for Metoprolol. Refill sent to pharmacy electronically. 

## 2011-06-25 ENCOUNTER — Ambulatory Visit (INDEPENDENT_AMBULATORY_CARE_PROVIDER_SITE_OTHER): Payer: Medicare Other | Admitting: Cardiovascular Disease

## 2011-06-25 ENCOUNTER — Encounter: Payer: Self-pay | Admitting: Cardiovascular Disease

## 2011-06-25 VITALS — BP 194/78 | HR 82 | Ht 64.0 in | Wt 176.4 lb

## 2011-06-25 DIAGNOSIS — I2510 Atherosclerotic heart disease of native coronary artery without angina pectoris: Secondary | ICD-10-CM

## 2011-06-25 DIAGNOSIS — I10 Essential (primary) hypertension: Secondary | ICD-10-CM

## 2011-06-25 DIAGNOSIS — E7800 Pure hypercholesterolemia, unspecified: Secondary | ICD-10-CM

## 2011-06-25 DIAGNOSIS — E119 Type 2 diabetes mellitus without complications: Secondary | ICD-10-CM

## 2011-06-25 DIAGNOSIS — R609 Edema, unspecified: Secondary | ICD-10-CM

## 2011-06-25 MED ORDER — LISINOPRIL-HYDROCHLOROTHIAZIDE 20-12.5 MG PO TABS: 2.0000 | ORAL_TABLET | Freq: Every day | ORAL | Status: DC

## 2011-06-25 MED ORDER — CLONIDINE HCL 0.1 MG PO TABS: 0.1000 mg | ORAL_TABLET | Freq: Two times a day (BID) | ORAL | Status: DC

## 2011-06-25 MED ORDER — ATORVASTATIN CALCIUM 20 MG PO TABS: 20.0000 mg | ORAL_TABLET | Freq: Every day | ORAL | Status: DC

## 2011-06-25 NOTE — Assessment & Plan Note (Addendum)
In an effort to control the cost of medications for her and her family, we will change to Crestor to Lipitor 20 mg daily. The daughter reports they're having problems as she is going into the "donut hole". Goal LDL is less than 70 given coronary and carotid disease.

## 2011-06-25 NOTE — Progress Notes (Signed)
Patient ID: Angela [name], female    DOB: 1925/08/18, 78 y.o.   MRN: [id number]  HPI Comments: Angela [name] is an 78 year old woman with dementia who currently lives at [facility], cared for by her 2 daughters, with history of coronary artery disease, cardiac cath in December 2006 with drug-eluting stent to the proximal LAD, femoral stent to mid LAD, PTCA to the diagonal, followup intervention for in-stent restenosis in May 2006 (PTCA), also with 40-60% carotid disease by ultrasound August 2009, recent evaluation for chest pain, last stress test March 2010 showing no ischemia, who has had problems with severe hypertension.  She presents today with no new symptoms. No paperwork is provided by [facility].  EKG from 05/14/2011 showed normal sinus rhythm with rate 60 beats per minute, no significant ST or T wave changes. She does report having chronic lower extremity edema as well as edema in her arms. This seemed to start after previous heart attack many years ago. They are uncertain if amlodipine could be contributing to her worsening swelling. One daughter reports swelling has been worse recently. They suggested she does season her food with salt at [facility]. She denies any shortness of breath or cough.    Outpatient Encounter Prescriptions as of 06/25/2011  Medication Sig Dispense Refill  . acetaminophen (TYLENOL) 325 MG tablet Take 2 tablets (650 mg total) by mouth 2 (two) times daily.      Marland Kitchen amLODipine (NORVASC) 10 MG tablet Take 1 tablet (10 mg total) by mouth daily.  30 tablet  11  . aspirin 81 MG tablet Take 81 mg by mouth daily.        . cholecalciferol (VITAMIN D) 1000 UNITS tablet Take 1 tablet (1,000 Units total) by mouth 2 (two) times daily.  60 tablet  6  . clopidogrel (PLAVIX) 75 MG tablet Take 1 tablet (75 mg total) by mouth daily.  30 tablet  3  . docusate sodium (COLACE) 100 MG capsule Take 100 mg by mouth 2 (two) times daily as needed.        . donepezil  (ARICEPT) 10 MG tablet Take 1 tablet (10 mg total) by mouth at bedtime.  30 tablet  12  . levETIRAcetam (KEPPRA XR) 500 MG 24 hr tablet Take 1 tablet (500 mg total) by mouth 2 (two) times daily.  60 tablet  12  . metoprolol (LOPRESSOR) 50 MG tablet Take 1 tablet (50 mg total) by mouth 2 (two) times daily.  60 tablet  3  . mirtazapine (REMERON) 15 MG tablet 1/2 tab po qhs.  15 tablet  5  . Misc. Devices (ROLLER [place]) MISC Use as directed       . Multiple Vitamin (MULTIVITAMIN) tablet Take 1 tablet by mouth daily.  30 tablet  6  . ranitidine (ZANTAC) 150 MG capsule Take 150 mg by mouth daily.        Marland Kitchen  lisinopril (PRINIVIL,ZESTRIL) 40 MG tablet Take 1 tablet (40 mg total) by mouth daily.  30 tablet  11  .  rosuvastatin (CRESTOR) 10 MG tablet Take 1 tablet (10 mg total) by mouth daily.  30 tablet  6    Review of Systems  Constitutional: Negative.   HENT: Negative.   Eyes: Negative.   Respiratory: Negative.   Cardiovascular: Positive for leg swelling.  Gastrointestinal: Negative.   Musculoskeletal: Positive for gait problem.  Skin: Negative.   Neurological: Negative.   Hematological: Negative.   Psychiatric/Behavioral: Negative.   All other systems reviewed and  are negative.   BP 194/78  Pulse 82  Ht 5\' 4"  (1.626 m)  Wt 176 lb 6.4 oz (80.015 kg)  BMI 30.28 kg/m2  Physical Exam  Nursing note and vitals reviewed. Constitutional: She is oriented to person, place, and time. She appears well-developed and well-nourished.  HENT:  Head: Normocephalic.  Nose: Nose normal.  Mouth/Throat: Oropharynx is clear and moist.  Eyes: Conjunctivae are normal. Pupils are equal, round, and reactive to light.  Neck: Normal range of motion. Neck supple. No JVD present.  Cardiovascular: Normal rate, regular rhythm, S1 normal, S2 normal and intact distal pulses.  Exam reveals no gallop and no friction rub.   Murmur heard.  Systolic murmur is present with a grade of 1/6  Pulmonary/Chest: Effort  normal and breath sounds normal. No respiratory distress. She has no wheezes. She has no rales. She exhibits no tenderness.  Abdominal: Soft. Bowel sounds are normal. She exhibits no distension. There is no tenderness.  Musculoskeletal: Normal range of motion. She exhibits edema. She exhibits no tenderness.  Lymphadenopathy:    She has no cervical adenopathy.  Neurological: She is alert and oriented to person, place, and time. Coordination normal.  Skin: Skin is warm and dry. No rash noted. No erythema.  Psychiatric: She has a normal mood and affect. Her behavior is normal. Judgment and thought content normal.         Assessment and Plan

## 2011-06-25 NOTE — Assessment & Plan Note (Signed)
Currently with no symptoms of angina. No further workup at this time. Continue current medication regimen. 

## 2011-06-25 NOTE — Assessment & Plan Note (Signed)
She does have chronic edema. We will try HCTZ for mild edema. We will need basic metabolic panel in several weeks. We can probably do this in followup in our office. We have asked her to hold off on adding additional salt to her food. Edema could also be coming from amlodipine and we have mentioned this to the daughter. I will not change it even as her blood pressure is very elevated.

## 2011-06-25 NOTE — Assessment & Plan Note (Signed)
I suspect she has a very liberal diet at [facility]. We have suggested she try to watch her diet, especially desserts.

## 2011-06-25 NOTE — Patient Instructions (Signed)
You are doing well.  When the crestor prescription is complete this month, Change to lipitor 20 mg daily  Stop lisinopril  Start lisinopril HCT 40/25 mg one daily Start clonidine 0.1 mg twice a day Monitor the blood pressure twice daily. Send blood pressure numbers to [facility] cardiology after 2 weeks Fax: [phone]  Please call us if you have new issues that need to be addressed before your next appt.  Your physician wants you to follow-up in: 1 months.

## 2011-06-25 NOTE — Assessment & Plan Note (Signed)
Blood pressure is very elevated which is the biggest issue today. The daughters do not really want to add additional blood pressure medications if possible. After long discussion, we will change lisinopril to lisinopril HCT given her worsening lower extremity edema. She will need blood work in several weeks. We will also add clonidine 0.1 mg twice a day. This can be titrated upwards as needed.

## 2011-07-08 ENCOUNTER — Telehealth: Payer: Self-pay | Admitting: *Deleted

## 2011-07-08 NOTE — Telephone Encounter (Signed)
Forms placed in your in box.

## 2011-07-09 NOTE — Telephone Encounter (Signed)
Faxed to [facility] and sent for scanning.

## 2011-07-09 NOTE — Telephone Encounter (Signed)
Please send back.  Signed.

## 2011-07-16 ENCOUNTER — Emergency Department: Payer: Self-pay | Admitting: Emergency Medicine

## 2011-07-16 LAB — URINALYSIS, COMPLETE
Blood: NEGATIVE
Glucose,UR: NEGATIVE mg/dL (ref 0–75)
Nitrite: NEGATIVE
Ph: 5 (ref 4.5–8.0)
Protein: >=500
Specific Gravity: 1.017 (ref 1.003–1.030)

## 2011-07-16 LAB — CBC
HGB: 12.6 g/dL (ref 12.0–16.0)
MCH: 31 pg (ref 26.0–34.0)
MCHC: 33.4 g/dL (ref 32.0–36.0)
MCV: 93 fL (ref 80–100)
Platelet: 150 10*3/uL (ref 150–440)
RDW: 13.4 % (ref 11.5–14.5)
WBC: 7.4 10*3/uL (ref 3.6–11.0)

## 2011-07-16 LAB — COMPREHENSIVE METABOLIC PANEL
Albumin: 3.9 g/dL (ref 3.4–5.0)
Anion Gap: 11 (ref 7–16)
BUN: 28 mg/dL — ABNORMAL HIGH (ref 7–18)
Calcium, Total: 9.4 mg/dL (ref 8.5–10.1)
Chloride: 108 mmol/L — ABNORMAL HIGH (ref 98–107)
EGFR (Non-African Amer.): 40 — ABNORMAL LOW
Glucose: 135 mg/dL — ABNORMAL HIGH (ref 65–99)
Osmolality: 291 (ref 275–301)
Potassium: 4.5 mmol/L (ref 3.5–5.1)
SGPT (ALT): 21 U/L
Total Protein: 7.6 g/dL (ref 6.4–8.2)

## 2011-07-16 LAB — CK TOTAL AND CKMB (NOT AT ARMC): CK, Total: 69 U/L (ref 21–215)

## 2011-07-17 ENCOUNTER — Telehealth: Payer: Self-pay | Admitting: Family Medicine

## 2011-07-17 LAB — URINE CULTURE

## 2011-07-17 NOTE — Telephone Encounter (Signed)
Order was sent yesterday for ua and UCX.  Please try to get those results or the hospital records and then set up OV.  Thanks.

## 2011-07-17 NOTE — Telephone Encounter (Signed)
Angela [name] with [employer] called and stated that the patient went to the ER yesterday with Chest Pain and was released at midnight.  She said that everything had been ruled out; however, patient is still experiencing some issues with being extremely confused.  Confusion began yesterday morning.  Toni Amend stated that the hospital did a U/A but will not release info and that the patient is still confused.  Toni Amend feels the patient needs to be seen and is asking for a call back at [phone] or [phone]. Thanks

## 2011-07-17 NOTE — Telephone Encounter (Signed)
Spoke with [employer]. They received order, but had to send patient to ER for chest pain. The ER did UA and won't give [facility] the results. Patient now has a "wanderguard" on because she is wandering off site. Records requested from [facility] and 30 min appt scheduled.

## 2011-07-19 ENCOUNTER — Ambulatory Visit (INDEPENDENT_AMBULATORY_CARE_PROVIDER_SITE_OTHER): Payer: Medicare Other | Admitting: Family Medicine

## 2011-07-19 ENCOUNTER — Encounter: Payer: Self-pay | Admitting: Family Medicine

## 2011-07-19 VITALS — BP 192/64 | HR 61 | Temp 98.3°F | Wt 176.0 lb

## 2011-07-19 DIAGNOSIS — N390 Urinary tract infection, site not specified: Secondary | ICD-10-CM

## 2011-07-19 DIAGNOSIS — I10 Essential (primary) hypertension: Secondary | ICD-10-CM

## 2011-07-19 DIAGNOSIS — R413 Other amnesia: Secondary | ICD-10-CM

## 2011-07-19 DIAGNOSIS — B369 Superficial mycosis, unspecified: Secondary | ICD-10-CM

## 2011-07-19 DIAGNOSIS — R300 Dysuria: Secondary | ICD-10-CM

## 2011-07-19 MED ORDER — SULFAMETHOXAZOLE-TRIMETHOPRIM 800-160 MG PO TABS: 1.0000 | ORAL_TABLET | Freq: Two times a day (BID) | ORAL | Status: DC

## 2011-07-19 MED ORDER — NYSTATIN 100000 UNIT/GM EX CREA: TOPICAL_CREAM | Freq: Two times a day (BID) | CUTANEOUS | Status: DC

## 2011-07-19 NOTE — Progress Notes (Signed)
Intermittent chest pain, mult episodes.  Had been seen by cards.  Recently with neg eval at [facility] ER.  CT chest was neg and EKG was reassuring.  Pt doesn't consistently remember the episodes, so hx is difficult.    Has had changes in mental status in last month with more confusion and agitation.  Was changed to lipitor and clonidine was added on.  Had some changes in dreams and hallucinations.  Known h/o dementia. Waking with confusion most AMs.  Last night she 'was sent to another place, another manor from [employer], I don't know where, but it was up there.  It was wonderful, but they didn't give me any food.'  This didn't happen per family.  One day she 'thought she was up [place].'  Some burning with urination yesterday.  Wasn't complaining of back pain yesterday.  She took cranberry juice and fluids yesterday.  She had some dysuria today.  She has some frequency from urination today, but this is likely from inc in fluid intake.    She's currently in ALF.  She wasn't started on abx at the ER.  Ucx was likely contaminated.    She hasn't been wandering and I am concerned about edema in the legs and constriction from anklet.  Will d/c alert anklet.    L inguinal canal irritation reported.  No pain.   PMH and SH reviewed  ROS: See HPI, otherwise noncontributory.  Meds, vitals, and allergies reviewed.   Nad, pleasant but unable to give complete details as above Mmm rrr ctab abd soft, not ttp Ext with 1+ edema L inguinal crease with patch of erythema c/w yeast

## 2011-07-19 NOTE — Patient Instructions (Signed)
I'll notify you about the labs test and what to do with the BP meds.  Septra DS twice a day for 3 days.  Nystatin to affected area twice a day until resolved.  Call back with update on 05/24/11.

## 2011-07-22 ENCOUNTER — Telehealth: Payer: Self-pay | Admitting: Family Medicine

## 2011-07-22 DIAGNOSIS — B369 Superficial mycosis, unspecified: Secondary | ICD-10-CM | POA: Insufficient documentation

## 2011-07-22 LAB — URINE CULTURE: Colony Count: >=100000

## 2011-07-22 NOTE — Assessment & Plan Note (Signed)
Treat with topical nystatin. 

## 2011-07-22 NOTE — Assessment & Plan Note (Signed)
Will need to treat, clear UTI.  Can address status at that point.  If continued declined, we can consider eval with CT head or possibly even d/c aricept if not thought to be useful.  >25 min spent with face to face with patient, >50% counseling and/or coordinating care. Old records from [facility] reviewed.

## 2011-07-22 NOTE — Telephone Encounter (Signed)
Caller: Toni Amend, RN from [facility]; PCP: Crawford Givens Clelia Croft); CB#: [phone]; Call regarding Question Related To Orders; Relates patient was seen recently and has order to discontinue Wander Guard/ bracelet.  Patient has wandered outside since order and has been checked with negative U/A on 07/19/11.  Would like to speak with provider or his assistant related to same.  Thank you.   OFFICE, PLEASE FOLLOW UP WITH CALLER RELATED TO SAME. THANK YOU.

## 2011-07-22 NOTE — Assessment & Plan Note (Signed)
I didn't have full med list from ALF.  Will likely in clonidine by 0.1mg  in AM and PM when the list arrives.

## 2011-07-22 NOTE — Assessment & Plan Note (Signed)
Likely, will treat and see if this affects her mentation.

## 2011-07-24 MED ORDER — CLONIDINE HCL 0.1 MG PO TABS: 0.2000 mg | ORAL_TABLET | Freq: Two times a day (BID) | ORAL | Status: DC

## 2011-07-24 NOTE — Telephone Encounter (Signed)
Orders given to Delice Bison by telephone at [facility] as instructed. Spoke to patient's daughter [name] and was advised that patient's mental status has not improved at all. Marylu Lund stated that she got a call early this morning about her mom's confusion and she has talked to her several times today and she is concerned because she is so confused. Please advise.

## 2011-07-24 NOTE — Telephone Encounter (Signed)
I called her daughter.  No sig change in memory with tx for UTI.  At this point, will stop aricept.  Will consider inc in remeron in the future.  Consider other meds in the future for agitation if needed.  She'll call back with update in about 1 week.    Please call [facility] and give verbal order to stop the aricept.  Thanks.

## 2011-07-24 NOTE — Telephone Encounter (Signed)
Please call them back.  Give order to reinstate wander guard/bracelet, since she was wandering.  Pt was on clonidine 0.1mg  po bid.  Give order to increase this to 0.2mg  po bid.  Thanks.  Med list updated.  Notify daughter of the above.  Please get update on mentation.  She did have Ucx positive, sensitive the septra we put her on.  If no change in mentation, we'll have to consider other options.

## 2011-07-25 ENCOUNTER — Ambulatory Visit: Payer: Medicare Other | Admitting: Cardiovascular Disease

## 2011-07-26 ENCOUNTER — Encounter: Payer: Self-pay | Admitting: *Deleted

## 2011-07-26 NOTE — Telephone Encounter (Signed)
Verbal order given.  They request a faxed order. Will print, have signed and fax.

## 2011-08-15 ENCOUNTER — Ambulatory Visit: Payer: Medicare Other | Admitting: Cardiovascular Disease

## 2011-08-26 ENCOUNTER — Ambulatory Visit: Payer: Medicare Other | Admitting: Cardiovascular Disease

## 2011-08-27 ENCOUNTER — Other Ambulatory Visit: Payer: Self-pay | Admitting: *Deleted

## 2011-08-27 MED ORDER — CLONIDINE HCL 0.1 MG PO TABS: 0.2000 mg | ORAL_TABLET | Freq: Two times a day (BID) | ORAL | Status: DC

## 2011-08-27 NOTE — Telephone Encounter (Signed)
Sent!

## 2011-08-27 NOTE — Telephone Encounter (Signed)
Meds list states:  Take 2 tablets by mouth twice daily.   Rx request states: Take 1 tab by mouth twice daily.

## 2011-08-27 NOTE — Telephone Encounter (Signed)
Correction:  The difference in sigs are accounted for by dosage.

## 2011-08-30 ENCOUNTER — Emergency Department: Payer: Self-pay | Admitting: Emergency Medicine

## 2011-08-30 LAB — COMPREHENSIVE METABOLIC PANEL
Albumin: 3.7 g/dL (ref 3.4–5.0)
Alkaline Phosphatase: 74 U/L (ref 50–136)
Chloride: 110 mmol/L — ABNORMAL HIGH (ref 98–107)
Creatinine: 1.44 mg/dL — ABNORMAL HIGH (ref 0.60–1.30)
EGFR (African American): 39 — ABNORMAL LOW
EGFR (Non-African Amer.): 33 — ABNORMAL LOW
Glucose: 124 mg/dL — ABNORMAL HIGH (ref 65–99)
Potassium: 4.5 mmol/L (ref 3.5–5.1)
SGOT(AST): 24 U/L (ref 15–37)
SGPT (ALT): 20 U/L
Total Protein: 7.8 g/dL (ref 6.4–8.2)

## 2011-08-30 LAB — URINALYSIS, COMPLETE
Bilirubin,UR: NEGATIVE
Blood: NEGATIVE
Glucose,UR: NEGATIVE mg/dL (ref 0–75)
Ketone: NEGATIVE
Nitrite: NEGATIVE
Ph: 5 (ref 4.5–8.0)
RBC,UR: 2 /HPF (ref 0–5)
Specific Gravity: 1.015 (ref 1.003–1.030)
Squamous Epithelial: 1
WBC UR: 15 /HPF (ref 0–5)

## 2011-08-30 LAB — CBC
MCH: 31.1 pg (ref 26.0–34.0)
MCHC: 33.2 g/dL (ref 32.0–36.0)
Platelet: 185 10*3/uL (ref 150–440)
RBC: 4.4 10*6/uL (ref 3.80–5.20)
RDW: 13.3 % (ref 11.5–14.5)
WBC: 8.4 10*3/uL (ref 3.6–11.0)

## 2011-09-02 ENCOUNTER — Ambulatory Visit (INDEPENDENT_AMBULATORY_CARE_PROVIDER_SITE_OTHER): Payer: Medicare Other | Admitting: Family Medicine

## 2011-09-02 ENCOUNTER — Encounter: Payer: Self-pay | Admitting: Family Medicine

## 2011-09-02 ENCOUNTER — Telehealth: Payer: Self-pay

## 2011-09-02 VITALS — BP 120/50 | HR 54 | Temp 97.4°F | Wt 174.8 lb

## 2011-09-02 DIAGNOSIS — N390 Urinary tract infection, site not specified: Secondary | ICD-10-CM

## 2011-09-02 DIAGNOSIS — R413 Other amnesia: Secondary | ICD-10-CM

## 2011-09-02 NOTE — Telephone Encounter (Signed)
I need to know the anticipated transfer date.  They'll need to pick it up on that date and deliver it for it to apply.

## 2011-09-02 NOTE — Patient Instructions (Addendum)
Drop off urine sample for culture next week.  See Angela [name] about your referral before you leave today. If the confusion isn't better by next week, we'll need to consider other options with meds.  Notify me with an updated next week.  Take care.

## 2011-09-02 NOTE — Assessment & Plan Note (Signed)
With behavior changes in setting of UTI.  Now on septra.  Will have Ucx done in about 1 week.  Will refer to uro. Would like input on suppressive tx.  In meantime, will transfer to memory unit.  Will work on [employer] as needed.  Would not change meds o/w now.  We can consider changing trazodone or removing mirtazepine.  Typical antipsychotics have additional complicating conditions and this was discussed with family.  Would not change meds o/w for now.  They understood. >25 min spent with face to face with patient, >50% counseling and/or coordinating care

## 2011-09-02 NOTE — Telephone Encounter (Signed)
Angela [name] pts daughter;pt seen [facility] ER 08/30/11 with UTI. Pt no better, confused also, does not know where she is. Pt to see Dr Para March today at 12:00 noon per [facility]aser request.Faxed request for [facility] ER visit.

## 2011-09-02 NOTE — Telephone Encounter (Signed)
Angela [name], pts daughter is speaking with [facility] in [place] about moving her mother to that facility. Marylu Lund will get FL2 form to Dr Para March tomorrow for completion.

## 2011-09-02 NOTE — Progress Notes (Signed)
Behavior changes, agitation with worsened memory. Recently with UTI and seen at ER.  H/o frequent UTIs and behavior changes associated with them . Now with need to transfer to memory unit, though this is unclear if this is related to dementia or recent illness or both.  No med changes other than addition of septra.    She has no memory of the events that led to ER eval.   PMH and SH reviewed  ROS: See HPI, otherwise noncontributory.  Meds, vitals, and allergies reviewed.   nad Speech fluent but she has poor recall about recent events rrr ctab abd soft, not ttp, normal BS Back w/o cva pain Ext with trace edema

## 2011-09-03 NOTE — Telephone Encounter (Signed)
Patient's daughter advised.  She will check with the facility to find out the exact transfer date.

## 2011-09-03 NOTE — Telephone Encounter (Signed)
Daughter says that she spoke with Minette Brine at the new facility and she states that they can accept the [facility] as long as it is dated within 30 days of being admitted.  She is planning to be admitted on Friday, (June 28) moving in on Saturday, (June 29).  Daughter says she has faxed the FL2 to our office and it can be faxed back to the facility when done.

## 2011-09-04 NOTE — Telephone Encounter (Signed)
Faxed as directed. 

## 2011-09-04 NOTE — Telephone Encounter (Signed)
Done, please send.  Thanks

## 2011-09-19 ENCOUNTER — Telehealth: Payer: Self-pay

## 2011-09-19 ENCOUNTER — Other Ambulatory Visit: Payer: Self-pay | Admitting: Family Medicine

## 2011-09-19 MED ORDER — LORAZEPAM 0.5 MG PO TABS: 0.2500 mg | ORAL_TABLET | Freq: Three times a day (TID) | ORAL | Status: DC | PRN

## 2011-09-19 NOTE — Telephone Encounter (Signed)
Per Dr. Para March, spoke with pt's daughter to let her know pt needed to stay on the Remeron and Trazodone while agitated; I asked if pt had had a cysto at urology appointment and pt's daughter said she wasn't sure but they scheduled something that looks for kidney stones.  Pt's daughter asked that Dr. Para March call her back or an appointment be made so she could discuss some concerns and questions.  Called [facility] to pass on same information and fax rx for Ativan and was told pt's med tech was unavailable.  I was encouraged to call back.

## 2011-09-20 NOTE — Addendum Note (Signed)
Addended by: Joaquim Nam on: 09/20/2011 04:23 PM   Modules accepted: Orders

## 2011-09-20 NOTE — Telephone Encounter (Signed)
[employer] and had to leave VM concerning order for med for agitation, order faxed back to [place]. Also spoke with daughter and she wasn't happy with Emi Holes because she's called 3 separate times and had to leave VM's per daughter she's going over to [place] on Monday, she did ask me to send order and script to [facility] to attention Darrell Jewel. Order faxed to [phone]  (Scanned and copied also)

## 2011-09-20 NOTE — Telephone Encounter (Signed)
I called and talked to her daughter Marylu Lund.  She was concerned about possible sedation.  It isn't clear if this if from the meds or from dementia.  We agreed to stop trazodone and follow clinically.  She'll call back as needed.

## 2011-09-20 NOTE — Telephone Encounter (Signed)
Please fax over an order to stop the trazodone.  Thanks.

## 2011-09-20 NOTE — Telephone Encounter (Signed)
Order faxed.

## 2011-09-22 ENCOUNTER — Emergency Department (HOSPITAL_COMMUNITY): Payer: Medicare Other

## 2011-09-22 ENCOUNTER — Emergency Department (HOSPITAL_COMMUNITY)
Admission: EM | Admit: 2011-09-22 | Discharge: 2011-09-22 | Disposition: A | Discharge status: Home / Self Care | Payer: Medicare Other | Attending: Emergency Medicine | Admitting: Emergency Medicine

## 2011-09-22 ENCOUNTER — Encounter (HOSPITAL_COMMUNITY): Payer: Self-pay | Admitting: *Deleted

## 2011-09-22 DIAGNOSIS — I10 Essential (primary) hypertension: Secondary | ICD-10-CM | POA: Insufficient documentation

## 2011-09-22 DIAGNOSIS — S9031XA Contusion of right foot, initial encounter: Secondary | ICD-10-CM

## 2011-09-22 DIAGNOSIS — Z79899 Other long term (current) drug therapy: Secondary | ICD-10-CM | POA: Insufficient documentation

## 2011-09-22 DIAGNOSIS — E785 Hyperlipidemia, unspecified: Secondary | ICD-10-CM | POA: Insufficient documentation

## 2011-09-22 DIAGNOSIS — M79609 Pain in unspecified limb: Secondary | ICD-10-CM | POA: Insufficient documentation

## 2011-09-22 DIAGNOSIS — I2510 Atherosclerotic heart disease of native coronary artery without angina pectoris: Secondary | ICD-10-CM | POA: Insufficient documentation

## 2011-09-22 DIAGNOSIS — S9030XA Contusion of unspecified foot, initial encounter: Secondary | ICD-10-CM | POA: Insufficient documentation

## 2011-09-22 DIAGNOSIS — W208XXA Other cause of strike by thrown, projected or falling object, initial encounter: Secondary | ICD-10-CM | POA: Insufficient documentation

## 2011-09-22 DIAGNOSIS — Z8673 Personal history of transient ischemic attack (TIA), and cerebral infarction without residual deficits: Secondary | ICD-10-CM | POA: Insufficient documentation

## 2011-09-22 DIAGNOSIS — E119 Type 2 diabetes mellitus without complications: Secondary | ICD-10-CM | POA: Insufficient documentation

## 2011-09-22 MED ORDER — OXYCODONE-ACETAMINOPHEN 5-325 MG PO TABS
1.0000 | ORAL_TABLET | Freq: Once | ORAL | Status: AC
  Administered 2011-09-22: 1 via ORAL
  Filled 2011-09-22: qty 1

## 2011-09-22 MED ORDER — OXYCODONE-ACETAMINOPHEN 5-325 MG PO TABS: 1.0000 | ORAL_TABLET | Freq: Three times a day (TID) | ORAL | Status: AC | PRN

## 2011-09-22 MED ORDER — ONDANSETRON 8 MG PO TBDP
8.0000 mg | ORAL_TABLET | Freq: Once | ORAL | Status: AC
  Administered 2011-09-22: 8 mg via ORAL
  Filled 2011-09-22: qty 1

## 2011-09-22 NOTE — ED Notes (Signed)
Patient transported to X-ray 

## 2011-09-22 NOTE — ED Notes (Signed)
Pt in memory care at [facility],  Pt dropped a little statue of doggie on her foot about 9pm,  Foot is swollen and bruise

## 2011-09-22 NOTE — ED Provider Notes (Signed)
History     CSN: [id number]  Arrival date & time 09/22/11  0026   First MD Initiated Contact with Patient 09/22/11 0259      Chief Complaint  Patient presents with  . Foot Pain     Patient is a 78 y.o. female presenting with lower extremity pain. The history is provided by the patient and a relative.  Foot Pain This is a new problem. Episode onset: earlier tonight. The problem occurs constantly. The problem has been gradually worsening. Pertinent negatives include no chest pain. The symptoms are aggravated by walking. The symptoms are relieved by rest. She has tried rest for the symptoms. The treatment provided mild relief.  pt has h/o dementia and lives in nursing facility She accidentally dropped a statue on her right foot She may have also fell after this occurred She reports pain in her right foot only She is otherwise at her baseline per daughter   Past Medical History  Diagnosis Date  . Diabetes mellitus 06/2005    Type II  . Hypertension 1960  . Hyperlipidemia 02/2004  . Memory loss     history of dementia  . CVA (cerebral infarction)     x 3  . Stroke 2001  . Echocardiogram abnormal     a. 09/2003 EF 55-65%, Mild AR;  b. 05/2008 Echo - EF 66-70%, no RWMA, mid-cavity obliteration @ end systole, DD, mild AI/MR/TR, NL RV  . MRI of brain abnormal 09/15/2003    Old lunar infarct, tight L MVERT ART stenosis 40% RICA  . TIA (transient ischemic attack) 11/2007  . Pneumonia 11/2007  . Abnormal head CT 11/06/2007    Old L hemispheric stroke scattered white matter Dz no acute changes  . Abnormal head MRI 12/08/2007    No acute changes  . Abnormal MRA, head 11/08/2007    Stenosis dist R carotid <50% RICA 75-80% LICA <50%  . Chest pain 10/2008    R/O'd Epig. pain, shaking after Morphine  . Cellulitis 07/2009    RLL  . Macular degeneration   . CAD (coronary artery disease)     a. 04/2004 - PCI LAD - cypher DES to prox LAD, Vision BMS to mid-LAD, PTCA to D1;  b. 07/2004 - cath/PCI :  LM-NL, LAD 29m ISR (treated with CBA), 70d, LCX minor irrges, RCA 20p, 30d;  c. 05/2008 Aden MV: EF 72%, no ischemia    Past Surgical History  Procedure Date  . Appendectomy     18 YOA  . Shoulder surgery 2004    Left  . Adenosine myoview 03/08/2004    Mild isch EF 72%  . Cardiac catheterization 04/09/2004    Severe single vessel disease, tx medically  . Cardiac catheterization 05/02/2004    PTCA X 3  . Instent restenosis 5/10 - 07/28/2004    [facility]  . Cardiac catheterization 07/19/2004    EF 65% Instent restenosis PTCA  . Abdominal ct 07/20/2004    Incr in retroperitoneal hemm  . Adenosine myoview 10/17/2004    Mild isch dist ant wall EF 69%  . Adenosine myoview 09/10/2005    Low risk  . Eeg     Pos left frontal temp focus  . Adenosine cardiolite 09/16/2006    No change, normal  . Carotid duplex, bilateral 11/09/2007    40-60% ICA Stenosis L>R  . Modified barium swallow 11/09/2007    No aspiration  . Technetium myoview 05/11/08    Normal  . Lower extremity US 10/26/08  No DVT  . Cataract     Family History  Problem Relation Age of Onset  . Aneurysm Mother   . Alcohol abuse Father   . Learning disabilities Sister   . Heart disease Sister     CAD  . Heart disease Brother   . Alcohol abuse Brother   . Alcohol abuse Brother   . Heart disease Brother     MI x 2    History  Substance Use Topics  . Smoking status: Never Smoker   . Smokeless tobacco: Never Used  . Alcohol Use: No    OB History    Grav Para Term Preterm Abortions TAB SAB Ect Mult Living                  Review of Systems  Cardiovascular: Negative for chest pain.  Musculoskeletal: Positive for joint swelling.    Allergies  Morphine sulfate  Home Medications   Current Outpatient Rx  Name Route Sig Dispense Refill  . ACETAMINOPHEN 325 MG PO TABS Oral Take 650 mg by mouth every 6 (six) hours as needed. For pain    . AMLODIPINE BESYLATE 10 MG PO TABS Oral Take 1 tablet (10 mg total) by mouth daily. 30  tablet 11  . ASPIRIN 81 MG PO CHEW Oral Chew 81 mg by mouth daily.    . ATORVASTATIN CALCIUM 20 MG PO TABS Oral Take 1 tablet (20 mg total) by mouth daily. 30 tablet 11  . ATORVASTATIN CALCIUM 20 MG PO TABS Oral Take 20 mg by mouth daily.    Marland Kitchen VITAMIN D 1000 UNITS PO TABS Oral Take 1 tablet (1,000 Units total) by mouth 2 (two) times daily. 60 tablet 6  . CLONIDINE HCL 0.2 MG PO TABS Oral Take 0.2 mg by mouth 2 (two) times daily.    Marland Kitchen CLOPIDOGREL BISULFATE 75 MG PO TABS Oral Take 1 tablet (75 mg total) by mouth daily. 30 tablet 3  . LEVETIRACETAM 500 MG PO TABS Oral Take 500 mg by mouth 2 (two) times daily.    Marland Kitchen LISINOPRIL-HYDROCHLOROTHIAZIDE 20-12.5 MG PO TABS Oral Take 2 tablets by mouth daily. 60 tablet 11  . METOPROLOL TARTRATE 50 MG PO TABS Oral Take 1 tablet (50 mg total) by mouth 2 (two) times daily. 60 tablet 3  . MIRTAZAPINE 15 MG PO TABS Oral Take 7.5 mg by mouth at bedtime.    Marland Kitchen RANITIDINE HCL 150 MG PO CAPS Oral Take 150 mg by mouth every morning.     Marland Kitchen TRIMETHOPRIM 100 MG PO TABS Oral Take 100 mg by mouth daily.    Marland Kitchen DOCUSATE SODIUM 100 MG PO CAPS Oral Take 100 mg by mouth 2 (two) times daily as needed. For stool softner    . LORAZEPAM 0.5 MG PO TABS Oral Take 0.5-1 tablets (0.25-0.5 mg total) by mouth every 8 (eight) hours as needed (agitation). 30 tablet 1  . NITROGLYCERIN 0.4 MG SL SUBL Sublingual Place 0.4 mg under the tongue every 5 (five) minutes as needed. For chest pain    . OXYCODONE-ACETAMINOPHEN 5-325 MG PO TABS Oral Take 1 tablet by mouth every 8 (eight) hours as needed for pain. 10 tablet 0    BP 152/59  Pulse 65  Temp 97.9 F (36.6 C) (Oral)  Resp 18  SpO2 100%  Physical Exam CONSTITUTIONAL: Well developed/well nourished HEAD AND FACE: Normocephalic/atraumatic EYES: EOMI/PERRL ENMT: Mucous membranes moist NECK: supple no meningeal signs SPINE:entire spine nontender, No bruising/crepitance/stepoffs noted to spine CV: S1/S2  noted, no murmurs/rubs/gallops  noted LUNGS: Lungs are clear to auscultation bilaterally, no apparent distress ABDOMEN: soft, nontender, no rebound or guarding GU:no cva tenderness NEURO: Pt is awake/alert, moves all extremitiesx4, pleasantly demented, no focal motor deficits EXTREMITIES: pulses normal, full ROM.  Tenderness/bruising to right distal foot with small abrasions noted but no active bleeding noted.  No lacerations noted.  No deformity noted to right foot/ankle.  Mild tenderness to bilateral right malleoli.  No right prox fib tenderness and she can range the right hip without tenderness elicited SKIN: warm, color normal PSYCH: no abnormalities of mood noted  ED Course  Procedures   Labs Reviewed - No data to display Dg Ankle Right Port  09/22/2011  *RADIOLOGY REPORT*  Clinical Data: Fall, lateral ankle pain.  PORTABLE RIGHT ANKLE - 2 VIEW  Comparison: Contemporaneous foot  Findings: No fractures identified however limited positioning due to patient discomfort.  Diffuse soft tissue swelling about the ankle and dorsal soft tissue swelling overlies the foot. Osteopenia.  IMPRESSION: No fracture demonstrated, may have been artifactual on the contemporaneous foot radiographs.If clinical concern for a fracture persists, recommend a repeat radiograph in 5-10 days to evaluate for interval change or callus formation.  Original Report Authenticated By: Waneta Martins, M.D.   Dg Foot Complete Right  09/22/2011  *RADIOLOGY REPORT*  Clinical Data: Fall, abrasion over the first digit.  RIGHT FOOT COMPLETE - 3+ VIEW  Comparison: None.  Findings: Diffuse osteopenia.  There is evidence of a distal tibial fracture. Intact Lisfranc joint.  No additional fracture or dislocation.  Soft tissue fullness of the first metatarsal phalangeal joint with mild hallux valgus.  IMPRESSION: Distal tibial fracture is age indeterminate.  Correlate with point tenderness.  Mild first digit hallux valgus deformity.  Original Report Authenticated By:  Waneta Martins, M.D.     1. Contusion of right foot    Advised for NWB, and will need repeat imaging in 5 days.  Postop boot ordered.  Daughter agreeable with plan   MDM  Nursing notes including past medical history and social history reviewed and considered in documentation xrays reviewed and considered         Joya Gaskins, MD 09/22/11 [phone]

## 2011-09-22 NOTE — ED Notes (Signed)
Two staff assisted pt to stretcher. Pt is not able to put weight on right foot. Daughter at bedside.

## 2011-09-27 ENCOUNTER — Other Ambulatory Visit: Payer: Self-pay | Admitting: Orthopedic Surgery

## 2011-09-27 ENCOUNTER — Ambulatory Visit
Admission: RE | Admit: 2011-09-27 | Discharge: 2011-09-27 | Disposition: A | Discharge status: Home / Self Care | Payer: Medicare Other | Source: Ambulatory Visit | Attending: Orthopedic Surgery | Admitting: Orthopedic Surgery

## 2011-09-27 DIAGNOSIS — R609 Edema, unspecified: Secondary | ICD-10-CM

## 2011-09-27 DIAGNOSIS — M79671 Pain in right foot: Secondary | ICD-10-CM

## 2011-09-30 ENCOUNTER — Observation Stay (HOSPITAL_COMMUNITY)
Admission: EM | Admit: 2011-09-30 | Discharge: 2011-10-02 | DRG: 684 | Disposition: A | Discharge status: Skilled Nursing Facility | Payer: Medicare Other | Attending: Internal Medicine | Admitting: Internal Medicine

## 2011-09-30 ENCOUNTER — Encounter (HOSPITAL_COMMUNITY): Payer: Self-pay

## 2011-09-30 ENCOUNTER — Emergency Department (HOSPITAL_COMMUNITY): Payer: Medicare Other

## 2011-09-30 DIAGNOSIS — H3530 Unspecified macular degeneration: Secondary | ICD-10-CM | POA: Insufficient documentation

## 2011-09-30 DIAGNOSIS — E875 Hyperkalemia: Secondary | ICD-10-CM

## 2011-09-30 DIAGNOSIS — W050XXA Fall from non-moving wheelchair, initial encounter: Secondary | ICD-10-CM | POA: Insufficient documentation

## 2011-09-30 DIAGNOSIS — N179 Acute kidney failure, unspecified: Principal | ICD-10-CM | POA: Diagnosis present

## 2011-09-30 DIAGNOSIS — E785 Hyperlipidemia, unspecified: Secondary | ICD-10-CM | POA: Insufficient documentation

## 2011-09-30 DIAGNOSIS — Z79899 Other long term (current) drug therapy: Secondary | ICD-10-CM | POA: Insufficient documentation

## 2011-09-30 DIAGNOSIS — E7800 Pure hypercholesterolemia, unspecified: Secondary | ICD-10-CM | POA: Diagnosis present

## 2011-09-30 DIAGNOSIS — F329 Major depressive disorder, single episode, unspecified: Secondary | ICD-10-CM

## 2011-09-30 DIAGNOSIS — I10 Essential (primary) hypertension: Secondary | ICD-10-CM

## 2011-09-30 DIAGNOSIS — R609 Edema, unspecified: Secondary | ICD-10-CM

## 2011-09-30 DIAGNOSIS — Z9861 Coronary angioplasty status: Secondary | ICD-10-CM | POA: Insufficient documentation

## 2011-09-30 DIAGNOSIS — E119 Type 2 diabetes mellitus without complications: Secondary | ICD-10-CM | POA: Diagnosis present

## 2011-09-30 DIAGNOSIS — R413 Other amnesia: Secondary | ICD-10-CM | POA: Diagnosis present

## 2011-09-30 DIAGNOSIS — F0390 Unspecified dementia without behavioral disturbance: Secondary | ICD-10-CM | POA: Insufficient documentation

## 2011-09-30 DIAGNOSIS — I2510 Atherosclerotic heart disease of native coronary artery without angina pectoris: Secondary | ICD-10-CM | POA: Diagnosis present

## 2011-09-30 DIAGNOSIS — S8990XA Unspecified injury of unspecified lower leg, initial encounter: Secondary | ICD-10-CM | POA: Insufficient documentation

## 2011-09-30 HISTORY — DX: Unspecified dementia, unspecified severity, without behavioral disturbance, psychotic disturbance, mood disturbance, and anxiety: F03.90

## 2011-09-30 LAB — CBC WITH DIFFERENTIAL/PLATELET
Band Neutrophils: 5 % (ref 0–10)
Blasts: 0 %
Eosinophils Absolute: 0.4 10*3/uL (ref 0.0–0.7)
Eosinophils Relative: 6 % — ABNORMAL HIGH (ref 0–5)
HCT: 32.1 % — ABNORMAL LOW (ref 36.0–46.0)
Hemoglobin: 10.8 g/dL — ABNORMAL LOW (ref 12.0–15.0)
Lymphocytes Relative: 16 % (ref 12–46)
Lymphs Abs: 1 10*3/uL (ref 0.7–4.0)
MCV: 92 fL (ref 78.0–100.0)
Monocytes Absolute: 0.2 10*3/uL (ref 0.1–1.0)
Monocytes Relative: 4 % (ref 3–12)
RBC: 3.49 MIL/uL — ABNORMAL LOW (ref 3.87–5.11)
RDW: 14 % (ref 11.5–15.5)
WBC: 6.1 10*3/uL (ref 4.0–10.5)

## 2011-09-30 LAB — PROTIME-INR: INR: 1.15 (ref 0.00–1.49)

## 2011-09-30 LAB — BASIC METABOLIC PANEL
CO2: 14 mEq/L — ABNORMAL LOW (ref 19–32)
Calcium: 10.8 mg/dL — ABNORMAL HIGH (ref 8.4–10.5)
Creatinine, Ser: 2.73 mg/dL — ABNORMAL HIGH (ref 0.50–1.10)
GFR calc non Af Amer: 15 mL/min — ABNORMAL LOW (ref >90)
Glucose, Bld: 121 mg/dL — ABNORMAL HIGH (ref 70–99)

## 2011-09-30 MED ORDER — SODIUM CHLORIDE 0.9 % IV BOLUS (SEPSIS)
1000.0000 mL | Freq: Once | INTRAVENOUS | Status: AC
  Administered 2011-10-01: 1000 mL via INTRAVENOUS

## 2011-09-30 MED ORDER — DEXTROSE 50 % IV SOLN
1.0000 | Freq: Once | INTRAVENOUS | Status: AC
  Administered 2011-10-01: 50 mL via INTRAVENOUS
  Filled 2011-09-30: qty 50

## 2011-09-30 MED ORDER — INSULIN ASPART 100 UNIT/ML ~~LOC~~ SOLN
10.0000 [IU] | Freq: Once | SUBCUTANEOUS | Status: AC
  Administered 2011-10-01: 10 [IU] via INTRAVENOUS
  Filled 2011-09-30: qty 1

## 2011-09-30 MED ORDER — SODIUM BICARBONATE 8.4 % IV SOLN
50.0000 meq | Freq: Once | INTRAVENOUS | Status: AC
  Administered 2011-10-01: 50 meq via INTRAVENOUS
  Filled 2011-09-30: qty 50

## 2011-09-30 MED ORDER — SODIUM BICARBONATE 4 % IV SOLN: 5.0000 mL | Freq: Once | INTRAVENOUS | Status: DC

## 2011-09-30 MED ORDER — SODIUM CHLORIDE 0.9 % IV SOLN
1.0000 g | Freq: Once | INTRAVENOUS | Status: AC
  Administered 2011-10-01: 1 g via INTRAVENOUS
  Filled 2011-09-30: qty 10

## 2011-09-30 MED ORDER — SODIUM POLYSTYRENE SULFONATE 15 GM/60ML PO SUSP
15.0000 g | Freq: Once | ORAL | Status: AC
  Administered 2011-10-01: 15 g via ORAL
  Filled 2011-09-30: qty 60

## 2011-09-30 NOTE — ED Notes (Signed)
In to place pt on ccm; pt refusing to allow staff to reassess vitals nor place on ccm; daughter at bedside attempting to discuss rationale for current treatment; pt continues to refuse; interm. pulling from staff; continues to cover head up with blanket; PA and attending made aware of same; daughter remains at bedside

## 2011-09-30 NOTE — ED Provider Notes (Signed)
History     CSN: [id number]  Arrival date & time 09/30/11  2157   First MD Initiated Contact with Patient 09/30/11 2201      Chief Complaint  Patient presents with  . Fall    attempted to stand from wheelchair resulting in falling from standing position; witnesses report hit head on carpeted floor at time of fall; however, no LOC; hematoma noted  to RFA; baseline LOC is confused (hx of dementia)        (Consider location/radiation/quality/duration/timing/severity/associated sxs/prior treatment) HPI  Patient presents to the ER by nursing home after falling out of her wheel chair. The facility said that she hit her head. Denied LOC. Pt has severe dementia at baseline and an extensive past medical history. She denies pain anywhere, i see no obvious deform ties, hematomas or injuries. VSS/NAD. Facility sent her to evaluation.  Past Medical History  Diagnosis Date  . Diabetes mellitus 06/2005    Type II  . Hypertension 1960  . Hyperlipidemia 02/2004  . Memory loss     history of dementia  . CVA (cerebral infarction)     x 3  . Stroke 2001  . Echocardiogram abnormal     a. 09/2003 EF 55-65%, Mild AR;  b. 05/2008 Echo - EF 66-70%, no RWMA, mid-cavity obliteration @ end systole, DD, mild AI/MR/TR, NL RV  . MRI of brain abnormal 09/15/2003    Old lunar infarct, tight L MVERT ART stenosis 40% RICA  . TIA (transient ischemic attack) 11/2007  . Pneumonia 11/2007  . Abnormal head CT 11/06/2007    Old L hemispheric stroke scattered white matter Dz no acute changes  . Abnormal head MRI 12/08/2007    No acute changes  . Abnormal MRA, head 11/08/2007    Stenosis dist R carotid <50% RICA 75-80% LICA <50%  . Chest pain 10/2008    R/O'd Epig. pain, shaking after Morphine  . Cellulitis 07/2009    RLL  . Macular degeneration   . CAD (coronary artery disease)     a. 04/2004 - PCI LAD - cypher DES to prox LAD, Vision BMS to mid-LAD, PTCA to D1;  b. 07/2004 - cath/PCI : LM-NL, LAD 83m ISR (treated with  CBA), 70d, LCX minor irrges, RCA 20p, 30d;  c. 05/2008 [facility] MV: EF 72%, no ischemia  . Dementia     Past Surgical History  Procedure Date  . Appendectomy     18 YOA  . Shoulder surgery 2004    Left  . Adenosine myoview 03/08/2004    Mild isch EF 72%  . Cardiac catheterization 04/09/2004    Severe single vessel disease, tx medically  . Cardiac catheterization 05/02/2004    PTCA X 3  . Instent restenosis 5/10 - 07/28/2004    [facility]  . Cardiac catheterization 07/19/2004    EF 65% Instent restenosis PTCA  . Abdominal ct 07/20/2004    Incr in retroperitoneal hemm  . Adenosine myoview 10/17/2004    Mild isch dist ant wall EF 69%  . Adenosine myoview 09/10/2005    Low risk  . Eeg     Pos left frontal temp focus  . Adenosine cardiolite 09/16/2006    No change, normal  . Carotid duplex, bilateral 11/09/2007    40-60% ICA Stenosis L>R  . Modified barium swallow 11/09/2007    No aspiration  . Technetium myoview 05/11/08    Normal  . Lower extremity US 10/26/08    No DVT  . Cataract  Family History  Problem Relation Age of Onset  . Aneurysm Mother   . Alcohol abuse Father   . Learning disabilities Sister   . Heart disease Sister     CAD  . Heart disease Brother   . Alcohol abuse Brother   . Alcohol abuse Brother   . Heart disease Brother     MI x 2    History  Substance Use Topics  . Smoking status: Never Smoker   . Smokeless tobacco: Never Used  . Alcohol Use: No    OB History    Grav Para Term Preterm Abortions TAB SAB Ect Mult Living                  Review of Systems  Unable due to dementia  Allergies  Morphine sulfate  Home Medications   Current Outpatient Rx  Name Route Sig Dispense Refill  . ACETAMINOPHEN 325 MG PO TABS Oral Take 650 mg by mouth every 6 (six) hours as needed. For pain    . AMLODIPINE BESYLATE 10 MG PO TABS Oral Take 1 tablet (10 mg total) by mouth daily. 30 tablet 11  . ASPIRIN 81 MG PO CHEW Oral Chew 81 mg by mouth daily.    . ATORVASTATIN  CALCIUM 20 MG PO TABS Oral Take 20 mg by mouth every morning.    Marland Kitchen VITAMIN D 1000 UNITS PO TABS Oral Take 1 tablet (1,000 Units total) by mouth 2 (two) times daily. 60 tablet 6  . CLONIDINE HCL 0.2 MG PO TABS Oral Take 0.2 mg by mouth 2 (two) times daily.    Marland Kitchen CLOPIDOGREL BISULFATE 75 MG PO TABS Oral Take 1 tablet (75 mg total) by mouth daily. 30 tablet 3  . CRANBERRY 450 MG PO CAPS Oral Take 1 tablet by mouth daily.    Marland Kitchen DOCUSATE SODIUM 100 MG PO CAPS Oral Take 100 mg by mouth 2 (two) times daily as needed. For stool softner    . LEVETIRACETAM 500 MG PO TABS Oral Take 500 mg by mouth 2 (two) times daily.    Marland Kitchen LISINOPRIL-HYDROCHLOROTHIAZIDE 20-12.5 MG PO TABS Oral Take 2 tablets by mouth daily. 60 tablet 11  . LORAZEPAM 0.5 MG PO TABS Oral Take 0.25 mg by mouth every 8 (eight) hours as needed. For aggitation    . METOPROLOL TARTRATE 50 MG PO TABS Oral Take 1 tablet (50 mg total) by mouth 2 (two) times daily. 60 tablet 3  . MIRTAZAPINE 15 MG PO TABS Oral Take 7.5 mg by mouth at bedtime.    . ADULT MULTIVITAMIN W/MINERALS CH Oral Take 1 tablet by mouth daily.    Marland Kitchen NITROGLYCERIN 0.4 MG SL SUBL Sublingual Place 0.4 mg under the tongue every 5 (five) minutes as needed. For chest pain    . OXYCODONE-ACETAMINOPHEN 5-325 MG PO TABS Oral Take 1 tablet by mouth every 8 (eight) hours as needed for pain. 10 tablet 0  . RANITIDINE HCL 150 MG PO CAPS Oral Take 150 mg by mouth every morning.     Marland Kitchen TRIMETHOPRIM 100 MG PO TABS Oral Take 100 mg by mouth daily.      BP 137/58  Pulse 71  Temp 98 F (36.7 C) (Oral)  Resp 18  SpO2 99%  Physical Exam  Nursing note and vitals reviewed. Constitutional: She appears well-developed and well-nourished. No distress.       Pt demented at baseline Tells me she is pregnant  HENT:  Head: Normocephalic and atraumatic.  Eyes: Pupils  are equal, round, and reactive to light.  Neck: Normal range of motion. Neck supple.  Cardiovascular: Normal rate and regular rhythm.     Pulmonary/Chest: Effort normal.  Abdominal: Soft.  Neurological: She is alert.  Skin: Skin is warm and dry.       Old bruising from previous fall    ED Course  Procedures (including critical care time)  Labs Reviewed  CBC WITH DIFFERENTIAL - Abnormal; Notable for the following:    RBC 3.49 (*)     Hemoglobin 10.8 (*)     HCT 32.1 (*)     Eosinophils Relative 6 (*)     All other components within normal limits  BASIC METABOLIC PANEL - Abnormal; Notable for the following:    Potassium 5.6 (*)     CO2 14 (*)     Glucose, Bld 121 (*)     BUN 74 (*)     Creatinine, Ser 2.73 (*)     Calcium 10.8 (*)     GFR calc non Af Amer 15 (*)     GFR calc Af Amer 17 (*)     All other components within normal limits  PROTIME-INR   Ct Head Wo Contrast  09/30/2011  *RADIOLOGY REPORT*  Clinical Data: Fall out of wheelchair.  CT HEAD WITHOUT CONTRAST  Technique:  Contiguous axial images were obtained from the base of the skull through the vertex without contrast.  Comparison: 11/06/2007  Findings: The brain shows advanced atrophy and small vessel ischemic changes.  Stable appearance of an old white matter infarct in the left frontal lobe with associated dilatation of the adjacent lateral ventricle.  The brain demonstrates no evidence of hemorrhage, acute cortical infarction, edema, mass effect, extra-axial fluid collection, hydrocephalus or mass lesion.  The skull is unremarkable.  IMPRESSION: No acute findings.  Atrophy, small vessel disease and old left frontal infarct.  Original Report Authenticated By: Reola Calkins, M.D.     1. Hyperkalemia   2. Acute renal failure       MDM      Date: 09/30/2011  Rate: 68  Rhythm: normal sinus rhythm  QRS Axis: normal  Intervals: normal  ST/T Wave abnormalities: normal  Conduction Disutrbances:none  Narrative Interpretation:   Old EKG Reviewed: unchanged from Nov 07, 2011  Patient being combative which has caused in delay of treatment of  hyperkalemia and acute renal failure  Patient admitted to [facility], team 10, Telemetry for further management. Pt may need sedation as she can not make her own medical decisions.         Dorthula Matas, PA 09/30/11 2355

## 2011-10-01 ENCOUNTER — Encounter (HOSPITAL_COMMUNITY): Payer: Self-pay | Admitting: General Practice

## 2011-10-01 ENCOUNTER — Observation Stay (HOSPITAL_COMMUNITY): Payer: Medicare Other

## 2011-10-01 DIAGNOSIS — N179 Acute kidney failure, unspecified: Secondary | ICD-10-CM

## 2011-10-01 DIAGNOSIS — E875 Hyperkalemia: Secondary | ICD-10-CM

## 2011-10-01 DIAGNOSIS — R413 Other amnesia: Secondary | ICD-10-CM

## 2011-10-01 LAB — CBC
HCT: 27.8 % — ABNORMAL LOW (ref 36.0–46.0)
Hemoglobin: 9.3 g/dL — ABNORMAL LOW (ref 12.0–15.0)
MCV: 91.7 fL (ref 78.0–100.0)
WBC: 5 10*3/uL (ref 4.0–10.5)

## 2011-10-01 LAB — BASIC METABOLIC PANEL
CO2: 16 mEq/L — ABNORMAL LOW (ref 19–32)
Chloride: 115 mEq/L — ABNORMAL HIGH (ref 96–112)
GFR calc Af Amer: 23 mL/min — ABNORMAL LOW (ref >90)
Potassium: 4.7 mEq/L (ref 3.5–5.1)

## 2011-10-01 MED ORDER — ASPIRIN 81 MG PO CHEW
81.0000 mg | CHEWABLE_TABLET | Freq: Every day | ORAL | Status: DC
  Administered 2011-10-01 – 2011-10-02 (×2): 81 mg via ORAL
  Filled 2011-10-01 (×2): qty 1

## 2011-10-01 MED ORDER — LORAZEPAM 2 MG/ML IJ SOLN
0.5000 mg | Freq: Once | INTRAMUSCULAR | Status: AC
  Administered 2011-10-01: 0.5 mg via INTRAVENOUS
  Filled 2011-10-01: qty 1

## 2011-10-01 MED ORDER — ATORVASTATIN CALCIUM 20 MG PO TABS
20.0000 mg | ORAL_TABLET | Freq: Every morning | ORAL | Status: DC
  Administered 2011-10-01 – 2011-10-02 (×2): 20 mg via ORAL
  Filled 2011-10-01 (×2): qty 1

## 2011-10-01 MED ORDER — METOPROLOL TARTRATE 50 MG PO TABS
50.0000 mg | ORAL_TABLET | Freq: Two times a day (BID) | ORAL | Status: DC
  Administered 2011-10-01 – 2011-10-02 (×4): 50 mg via ORAL
  Filled 2011-10-01 (×5): qty 1

## 2011-10-01 MED ORDER — DOCUSATE SODIUM 100 MG PO CAPS: 100.0000 mg | ORAL_CAPSULE | Freq: Two times a day (BID) | ORAL | Status: DC | PRN

## 2011-10-01 MED ORDER — HALOPERIDOL LACTATE 5 MG/ML IJ SOLN
1.0000 mg | Freq: Four times a day (QID) | INTRAMUSCULAR | Status: DC | PRN
  Administered 2011-10-01: 2 mg via INTRAVENOUS
  Filled 2011-10-01: qty 1

## 2011-10-01 MED ORDER — FAMOTIDINE 10 MG PO TABS
10.0000 mg | ORAL_TABLET | Freq: Every day | ORAL | Status: DC
  Administered 2011-10-01 – 2011-10-02 (×2): 10 mg via ORAL
  Filled 2011-10-01 (×2): qty 1

## 2011-10-01 MED ORDER — CLONIDINE HCL 0.2 MG PO TABS
0.2000 mg | ORAL_TABLET | Freq: Two times a day (BID) | ORAL | Status: DC
  Administered 2011-10-01 – 2011-10-02 (×4): 0.2 mg via ORAL
  Filled 2011-10-01 (×5): qty 1

## 2011-10-01 MED ORDER — CLOPIDOGREL BISULFATE 75 MG PO TABS
75.0000 mg | ORAL_TABLET | Freq: Every day | ORAL | Status: DC
  Administered 2011-10-01 – 2011-10-02 (×2): 75 mg via ORAL
  Filled 2011-10-01 (×3): qty 1

## 2011-10-01 MED ORDER — ONDANSETRON HCL 4 MG/2ML IJ SOLN: 4.0000 mg | Freq: Four times a day (QID) | INTRAMUSCULAR | Status: DC | PRN

## 2011-10-01 MED ORDER — MIRTAZAPINE 7.5 MG PO TABS
7.5000 mg | ORAL_TABLET | Freq: Every day | ORAL | Status: DC
  Administered 2011-10-01 (×2): 7.5 mg via ORAL
  Filled 2011-10-01 (×3): qty 1

## 2011-10-01 MED ORDER — LEVETIRACETAM 500 MG PO TABS
500.0000 mg | ORAL_TABLET | Freq: Two times a day (BID) | ORAL | Status: DC
  Administered 2011-10-01 – 2011-10-02 (×4): 500 mg via ORAL
  Filled 2011-10-01 (×5): qty 1

## 2011-10-01 MED ORDER — ONDANSETRON HCL 4 MG PO TABS: 4.0000 mg | ORAL_TABLET | Freq: Four times a day (QID) | ORAL | Status: DC | PRN

## 2011-10-01 MED ORDER — HALOPERIDOL LACTATE 5 MG/ML IJ SOLN
5.0000 mg | Freq: Once | INTRAMUSCULAR | Status: AC
  Administered 2011-10-01: 5 mg via INTRAMUSCULAR
  Filled 2011-10-01: qty 1

## 2011-10-01 MED ORDER — DEXTROSE-NACL 5-0.9 % IV SOLN
INTRAVENOUS | Status: DC
  Administered 2011-10-01: 17:00:00 via INTRAVENOUS
  Administered 2011-10-01: 100 mL/h via INTRAVENOUS
  Administered 2011-10-02: 12:00:00 via INTRAVENOUS

## 2011-10-01 MED ORDER — HEPARIN SODIUM (PORCINE) 5000 UNIT/ML IJ SOLN
5000.0000 [IU] | Freq: Three times a day (TID) | INTRAMUSCULAR | Status: DC
  Administered 2011-10-01 – 2011-10-02 (×4): 5000 [IU] via SUBCUTANEOUS
  Filled 2011-10-01 (×7): qty 1

## 2011-10-01 MED ORDER — SODIUM CHLORIDE 0.9 % IJ SOLN: 3.0000 mL | Freq: Two times a day (BID) | INTRAMUSCULAR | Status: DC

## 2011-10-01 MED ORDER — AMLODIPINE BESYLATE 10 MG PO TABS
10.0000 mg | ORAL_TABLET | Freq: Every day | ORAL | Status: DC
  Administered 2011-10-01 – 2011-10-02 (×2): 10 mg via ORAL
  Filled 2011-10-01 (×2): qty 1

## 2011-10-01 NOTE — Progress Notes (Signed)
[facility] PROGRESS NOTE  Angela [name] WUJ:[id number] DOB: 1925/04/20 DOA: 09/30/2011 PCP: Crawford Givens, MD  Assessment/Plan: Active Problems:  DIABETES MELLITUS, TYPE II  HYPERCHOLESTEROLEMIA, MIXED  SYMPTOM, MEMORY LOSS  CAD (coronary artery disease)  ARF (acute renal failure)  Hyperkalemia   1. ARF (acute renal failure): Patient presented with creatinine of 2.73 and BUN 74, against a background of known baseline creatinine of 1.4. This is consistent with ARF, due to dehydration and poor oral intake, possibly occasioned by dementia. She is being managed with iv fluids, and creatinine has already improved at 2.14. Further improvement is anticipated. Hyperkalemia due to ARF, was addressed with Calcium gluconate, D50 and IV Insulin and Kayexalate, and has already resolved.  2. Dementia: Patient has advanced dementia, and is combative at baseline. She will be continued on pre-admission psychotropics.  3. CAD (coronary artery disease): Stable?Asymptomatic.  4. HYPERCHOLESTEROLEMIA, MIXED: On statin.  5. DIABETES MELLITUS, TYPE II: Managing with SSI. CBGs are controlled.  6. Right foot wound: This does not appear infected. For local care.      Code Status: DNR/DNI Family Communication:  Disposition Plan: For [facility] when stable.    Brief narrative:  78 y.o. female with hx of advanced dementia, generally agitated, with DM, HTN, Hyperlipidemia, known CAD, macular degeneration, fell on 09/30/11 and was brought to the ER. He daughters were at her bedside and had comptemplated no hospitalization and no treatment originally. After taking sometime to think through, they are agreeable to have her admitted and treatment of her hyperkalemia ( K=5.6) and ARF (Cr 2.73-baseline 1.4). Family told me she has been very combative and tries to bite people as well. She is a DNR and family has been seriously comptemplating CMO and "DO NOT HOSPITALIZE" status. Hospitalist was asked to admit patient  for acute renal failure and hyperkalemia.  Consultants: N/A Procedures:  Abdominal ultrasound.   Head CT scan.  Antibiotics:  N/A  HPI/Subjective: No new issues.   Objective: Vital signs in last 24 hours: Temp:  [97.4 F (36.3 C)-98 F (36.7 C)] 97.5 F (36.4 C) (07/23 0940) Pulse Rate:  [71-99] 78  (07/23 0940) Resp:  [18-20] 18  (07/23 0940) BP: (113-177)/(58-74) 145/70 mmHg (07/23 0940) SpO2:  [97 %-100 %] 97 % (07/23 0940) Weight:  [73.3 kg (161 lb 9.6 oz)] 73.3 kg (161 lb 9.6 oz) (07/23 0221) Weight change:  Last BM Date: 10/01/11 (loose,tan)  Intake/Output from previous day:       Physical Exam: General: Sleeping comfortably, not short of breath at rest.  HEENT:  Mild clinical pallor, no jaundice, no conjunctival injection or discharge. NECK:  Supple, JVP not seen, no carotid bruits, no palpable lymphadenopathy, no palpable goiter. CHEST:  Clinically clear to auscultation, no wheezes, no crackles. HEART:  Sounds 1 and 2 heard, normal, regular, no murmurs. ABDOMEN:  Full, soft, non-tender, no palpable organomegaly, no palpable masses, normal bowel sounds. GENITALIA:  Not examined. LOWER EXTREMITIES:  Mild pitting edema, palpable peripheral pulses. Patient has a superficial wound on dorsum of right foot, just above great toe.  MUSCULOSKELETAL SYSTEM:  Generalized osteoarthritic changes, otherwise, normal. CENTRAL NERVOUS SYSTEM:  No focal neurologic deficit on gross examination.  Lab Results:  [facility] 10/01/11 0555 09/30/11 2223  WBC 5.0 6.1  HGB 9.3* 10.8*  HCT 27.8* 32.1*  PLT 156 165    Basename 10/01/11 0555 09/30/11 2223  NA 145 141  K 4.7 5.6*  CL 115* 112  CO2 16* 14*  GLUCOSE 99 121*  BUN  64* 74*  CREATININE 2.14* 2.73*  CALCIUM 10.3 10.8*   No results found for this or any previous visit (from the past 240 hour(s)).   Studies/Results: Ct Head Wo Contrast  09/30/2011  *RADIOLOGY REPORT*  Clinical Data: Fall out of wheelchair.  CT  HEAD WITHOUT CONTRAST  Technique:  Contiguous axial images were obtained from the base of the skull through the vertex without contrast.  Comparison: 11/06/2007  Findings: The brain shows advanced atrophy and small vessel ischemic changes.  Stable appearance of an old white matter infarct in the left frontal lobe with associated dilatation of the adjacent lateral ventricle.  The brain demonstrates no evidence of hemorrhage, acute cortical infarction, edema, mass effect, extra-axial fluid collection, hydrocephalus or mass lesion.  The skull is unremarkable.  IMPRESSION: No acute findings.  Atrophy, small vessel disease and old left frontal infarct.  Original Report Authenticated By: Reola Calkins, M.D.   US Renal Port  10/01/2011  *RADIOLOGY REPORT*  Clinical Data: Acute renal failure.  RENAL/URINARY TRACT ULTRASOUND COMPLETE  Comparison:  None  Findings:  Right Kidney:  7.8 cm in length.  Mild renal cortical thinning is noted.  No hydronephrosis.  A small cyst is demonstrated.  Left Kidney:  11.4 cm in length.  Normal renal echogenicity.  Mild renal cortical thinning.  No hydronephrosis.  A 4.9 x 5.2 x 4.4 cm mid pole cyst is noted.  Bladder:  Normal.  IMPRESSION:  1.  Mild renal cortical thinning but no hydronephrosis. 2.  Bilateral cysts.  Original Report Authenticated By: P. Loralie Champagne, M.D.    Medications: Scheduled Meds:   . amLODipine  10 mg Oral Daily  . aspirin  81 mg Oral Daily  . atorvastatin  20 mg Oral q morning - 10a  . calcium gluconate 1 GM IV  1 g Intravenous Once  . cloNIDine  0.2 mg Oral BID  . clopidogrel  75 mg Oral Q breakfast  . dextrose  1 ampule Intravenous Once  . famotidine  10 mg Oral Daily  . haloperidol lactate  5 mg Intramuscular Once  . heparin  5,000 Units Subcutaneous Q8H  . insulin aspart  10 Units Intravenous Once  . levETIRAcetam  500 mg Oral BID  . LORazepam  0.5 mg Intravenous Once  . metoprolol  50 mg Oral BID  . mirtazapine  7.5 mg Oral QHS  .  sodium bicarbonate  50 mEq Intravenous Once  . sodium chloride  1,000 mL Intravenous Once  . sodium chloride  3 mL Intravenous Q12H  . sodium polystyrene  15 g Oral Once  . DISCONTD: sodium bicarbonate  5 mL Intravenous Once   Continuous Infusions:   . dextrose 5 % and 0.9% NaCl 100 mL/hr (10/01/11 0230)   PRN Meds:.docusate sodium, haloperidol lactate, ondansetron (ZOFRAN) IV, ondansetron    LOS: 1 day   Brent Noto,CHRISTOPHER  [facility] Pager [phone]. If 8PM-8AM, please contact night-coverage at [email or web address], password [facility] 10/01/2011, 11:19 AM  LOS: 1 day

## 2011-10-01 NOTE — H&P (Signed)
[facility] History and Physical  Angela [name] ZOX:[id number] DOB: 02-May-1925    PCP:   Crawford Givens, MD   Chief Complaint:  Brought to the ER because of a fall, found to be in ARF.  HPI: Angela [name] is an 78 y.o. female with hx of advanced dementia, generally agitated, with DM, HTN, Hyperlipidemia, known CAD, macular degeneration, fell today and was brought to the ER.  He daughters were at her bedside and had comptemplated no hospitalization and no treatment originally.  After taking sometime to think thru, they are agreeable to have her admitted and treatment of her hyperkalemia ( K=5.6) and ARF (Cr 2.73-baseline 1.4).  Family told me she has been very combative and tries to bite people as well. She is a DNR and family has been seriously comptemplating CMO and "DO NOT HOSPITALIZE" status.  Hospitalist was asked to admit patient for acute renal failure and hyperkalemia.  Rewiew of Systems: Unable to obtain reliable ROS.    Past Medical History  Diagnosis Date  . Diabetes mellitus 06/2005    Type II  . Hypertension 1960  . Hyperlipidemia 02/2004  . Memory loss     history of dementia  . CVA (cerebral infarction)     x 3  . Stroke 2001  . Echocardiogram abnormal     a. 09/2003 EF 55-65%, Mild AR;  b. 05/2008 Echo - EF 66-70%, no RWMA, mid-cavity obliteration @ end systole, DD, mild AI/MR/TR, NL RV  . MRI of brain abnormal 09/15/2003    Old lunar infarct, tight L MVERT ART stenosis 40% RICA  . TIA (transient ischemic attack) 11/2007  . Pneumonia 11/2007  . Abnormal head CT 11/06/2007    Old L hemispheric stroke scattered white matter Dz no acute changes  . Abnormal head MRI 12/08/2007    No acute changes  . Abnormal MRA, head 11/08/2007    Stenosis dist R carotid <50% RICA 75-80% LICA <50%  . Chest pain 10/2008    R/O'd Epig. pain, shaking after Morphine  . Cellulitis 07/2009    RLL  . Macular degeneration   . CAD (coronary artery disease)     a. 04/2004 - PCI LAD  - cypher DES to prox LAD, Vision BMS to mid-LAD, PTCA to D1;  b. 07/2004 - cath/PCI : LM-NL, LAD 14m ISR (treated with CBA), 70d, LCX minor irrges, RCA 20p, 30d;  c. 05/2008 Aden MV: EF 72%, no ischemia  . Dementia     Past Surgical History  Procedure Date  . Appendectomy     18 YOA  . Shoulder surgery 2004    Left  . Adenosine myoview 03/08/2004    Mild isch EF 72%  . Cardiac catheterization 04/09/2004    Severe single vessel disease, tx medically  . Cardiac catheterization 05/02/2004    PTCA X 3  . Instent restenosis 5/10 - 07/28/2004    [facility]  . Cardiac catheterization 07/19/2004    EF 65% Instent restenosis PTCA  . Abdominal ct 07/20/2004    Incr in retroperitoneal hemm  . Adenosine myoview 10/17/2004    Mild isch dist ant wall EF 69%  . Adenosine myoview 09/10/2005    Low risk  . Eeg     Pos left frontal temp focus  . Adenosine cardiolite 09/16/2006    No change, normal  . Carotid duplex, bilateral 11/09/2007    40-60% ICA Stenosis L>R  . Modified barium swallow 11/09/2007    No aspiration  . Technetium myoview  05/11/08    Normal  . Lower extremity US 10/26/08    No DVT  . Cataract     Medications:  HOME MEDS: Prior to Admission medications   Medication Sig Start Date End Date Taking? Authorizing Provider  acetaminophen (MAPAP) 325 MG tablet Take 650 mg by mouth every 6 (six) hours as needed. For pain   Yes Historical Provider, MD  amLODipine (NORVASC) 10 MG tablet Take 1 tablet (10 mg total) by mouth daily. 05/14/11 05/13/12 Yes Ok Anis, NP  aspirin 81 MG chewable tablet Chew 81 mg by mouth daily.   Yes Historical Provider, MD  atorvastatin (LIPITOR) 20 MG tablet Take 20 mg by mouth every morning. 06/25/11 06/24/12 Yes Antonieta Iba, MD  cholecalciferol (VITAMIN D) 1000 UNITS tablet Take 1 tablet (1,000 Units total) by mouth 2 (two) times daily. 02/11/11  Yes Joaquim Nam, MD  cloNIDine (CATAPRES) 0.2 MG tablet Take 0.2 mg by mouth 2 (two) times daily.   Yes Historical  Provider, MD  clopidogrel (PLAVIX) 75 MG tablet Take 1 tablet (75 mg total) by mouth daily. 03/15/11  Yes Joaquim Nam, MD  Cranberry 450 MG CAPS Take 1 tablet by mouth daily.   Yes Historical Provider, MD  docusate sodium (COLACE) 100 MG capsule Take 100 mg by mouth 2 (two) times daily as needed. For stool softner   Yes Historical Provider, MD  levETIRAcetam (KEPPRA) 500 MG tablet Take 500 mg by mouth 2 (two) times daily.   Yes Historical Provider, MD  lisinopril-hydrochlorothiazide (PRINZIDE,ZESTORETIC) 20-12.5 MG per tablet Take 2 tablets by mouth daily. 06/25/11 06/24/12 Yes Antonieta Iba, MD  LORazepam (ATIVAN) 0.5 MG tablet Take 0.25 mg by mouth every 8 (eight) hours as needed. For aggitation   Yes Historical Provider, MD  metoprolol (LOPRESSOR) 50 MG tablet Take 1 tablet (50 mg total) by mouth 2 (two) times daily. 06/10/11  Yes Joaquim Nam, MD  mirtazapine (REMERON) 15 MG tablet Take 7.5 mg by mouth at bedtime.   Yes Historical Provider, MD  Multiple Vitamin (MULTIVITAMIN WITH MINERALS) TABS Take 1 tablet by mouth daily.   Yes Historical Provider, MD  nitroGLYCERIN (NITROSTAT) 0.4 MG SL tablet Place 0.4 mg under the tongue every 5 (five) minutes as needed. For chest pain   Yes Historical Provider, MD  oxyCODONE-acetaminophen (PERCOCET) 5-325 MG per tablet Take 1 tablet by mouth every 8 (eight) hours as needed for pain. 09/22/11 10/02/11 Yes Joya Gaskins, MD  ranitidine (ZANTAC) 150 MG capsule Take 150 mg by mouth every morning.    Yes Historical Provider, MD  trimethoprim (TRIMPEX) 100 MG tablet Take 100 mg by mouth daily.   Yes Historical Provider, MD     Allergies:  Allergies  Allergen Reactions  . Morphine Sulfate     REACTION: shaking    Social History:   reports that she has never smoked. She has never used smokeless tobacco. She reports that she does not drink alcohol or use illicit drugs.  Family History: Family History  Problem Relation Age of Onset  . Aneurysm  Mother   . Alcohol abuse Father   . Learning disabilities Sister   . Heart disease Sister     CAD  . Heart disease Brother   . Alcohol abuse Brother   . Alcohol abuse Brother   . Heart disease Brother     MI x 2     Physical Exam: Filed Vitals:   09/30/11 2210 10/01/11 0110 10/01/11 0221  BP: 137/58 113/68 155/63  Pulse: 71  99  Temp: 98 F (36.7 C) 97.8 F (36.6 C) 97.9 F (36.6 C)  TempSrc: Oral Oral Oral  Resp: 18  20  Height:   5\' 2"  (1.575 m)  Weight:   73.3 kg (161 lb 9.6 oz)  SpO2: 99% 100% 99%   Blood pressure 155/63, pulse 99, temperature 97.9 F (36.6 C), temperature source Oral, resp. rate 20, height 5\' 2"  (1.575 m), weight 73.3 kg (161 lb 9.6 oz), SpO2 99.00%.  GEN:   patient lying in the stretcher in no acute distress PSYCH:  Confused, lethargic ; does not appear anxious or depressed;  HEENT: Mucous membranes pink and anicteric; PERRLA; EOM intact; no cervical lymphadenopathy nor thyromegaly or carotid bruit; no JVD; There were no stridor. Neck is very supple. Breasts:: Not examined CHEST WALL: No tenderness CHEST: Normal respiration, clear to auscultation bilaterally.  HEART: Regular rate and rhythm.  There are no murmur, rub, or gallops.   BACK: No kyphosis or scoliosis; no CVA tenderness ABDOMEN: soft and non-tender; no masses, no organomegaly, normal abdominal bowel sounds; no pannus; no intertriginous candida. There is no rebound and no distention. Rectal Exam: Not done EXTREMITIES: No bone or joint deformity; age-appropriate arthropathy of the hands and knees; no edema; no ulcerations.  There is no calf tenderness. Genitalia: not examined PULSES: 2+ and symmetric SKIN: Normal hydration no rash or ulceration CNS: Cranial nerves 2-12 grossly intact no focal lateralizing neurologic deficit.   Labs on Admission:  Basic Metabolic Panel:  Lab 09/30/11 9604  NA 141  K 5.6*  CL 112  CO2 14*  GLUCOSE 121*  BUN 74*  CREATININE 2.73*  CALCIUM 10.8*    MG --  PHOS --   Liver Function Tests: No results found for this basename: AST:5,ALT:5,ALKPHOS:5,BILITOT:5,PROT:5,ALBUMIN:5 in the last 168 hours No results found for this basename: LIPASE:5,AMYLASE:5 in the last 168 hours No results found for this basename: AMMONIA:5 in the last 168 hours CBC:  Lab 09/30/11 2223  WBC 6.1  NEUTROABS 4.5  HGB 10.8*  HCT 32.1*  MCV 92.0  PLT 165   Cardiac Enzymes: No results found for this basename: CKTOTAL:5,CKMB:5,CKMBINDEX:5,TROPONINI:5 in the last 168 hours  CBG:  Lab 10/01/11 0217  GLUCAP 136*     Radiological Exams on Admission: Ct Head Wo Contrast  09/30/2011  *RADIOLOGY REPORT*  Clinical Data: Fall out of wheelchair.  CT HEAD WITHOUT CONTRAST  Technique:  Contiguous axial images were obtained from the base of the skull through the vertex without contrast.  Comparison: 11/06/2007  Findings: The brain shows advanced atrophy and small vessel ischemic changes.  Stable appearance of an old white matter infarct in the left frontal lobe with associated dilatation of the adjacent lateral ventricle.  The brain demonstrates no evidence of hemorrhage, acute cortical infarction, edema, mass effect, extra-axial fluid collection, hydrocephalus or mass lesion.  The skull is unremarkable.  IMPRESSION: No acute findings.  Atrophy, small vessel disease and old left frontal infarct.  Original Report Authenticated By: Reola Calkins, M.D.   US Renal Port  10/01/2011  *RADIOLOGY REPORT*  Clinical Data: Acute renal failure.  RENAL/URINARY TRACT ULTRASOUND COMPLETE  Comparison:  None  Findings:  Right Kidney:  7.8 cm in length.  Mild renal cortical thinning is noted.  No hydronephrosis.  A small cyst is demonstrated.  Left Kidney:  11.4 cm in length.  Normal renal echogenicity.  Mild renal cortical thinning.  No hydronephrosis.  A 4.9 x 5.2 x  4.4 cm mid pole cyst is noted.  Bladder:  Normal.  IMPRESSION:  1.  Mild renal cortical thinning but no hydronephrosis. 2.   Bilateral cysts.  Original Report Authenticated By: P. Loralie Champagne, M.D.    EKG: Independently reviewed there is no peaked T waves.   Assessment/Plan Present on Admission:  .ARF (acute renal failure) .SYMPTOM, MEMORY LOSS .CAD (coronary artery disease) .HYPERCHOLESTEROLEMIA, MIXED .DIABETES MELLITUS, TYPE II   PLAN:  After family debated a while about whether to even admit her for IVF rehydration, they agreed to hospitalization.  Will sedate her a little, start IVF, and Tx hyperkalemia with the Cal, D50 and IV Insulin and K exalate.  I will continue her other meds as well.  Will do insulin sliding scale.  She is a DNR and we will honor her wish.  Will admit her to telemetry under [facility].  Other plans as per orders.  Code Status: DNR.   Houston Siren, MD. [facility] Pager [phone] 7pm to 7am.  10/01/2011, 5:36 AM

## 2011-10-01 NOTE — ED Notes (Signed)
Healthcare Power of [place], Living Will, and Power of Attorney forms placed on pt's chart as presented by pt's daughter

## 2011-10-01 NOTE — ED Notes (Signed)
Pt remains combative when attempting any procedure; family at bedside; discussed possibility of sedating for IV insertion - family members in agreement

## 2011-10-01 NOTE — ED Notes (Signed)
Ecchymosis noted to RFA; swelling and ecchymosis noted to rgt foot; family reports foot injury is from previous fall

## 2011-10-01 NOTE — Consult Note (Signed)
WOC consult Note Reason for Consult: req to eval for wound care for area of trauma on the right dorsal foot.  Pt unable to provide any hx to [facility] nurse, noted that H&P mentioned trauma to this area from a fall. She has ecchymosis of all 5 toes along with a skin tear with surrounding ecchymosis of the dorsal foot at the first metatarsal. Wound type:trauma/skin tear Measurement: Area extends 4cm x 3.5cm x 0.2cm  Wound bed: only minimally open, skin flap intact, wound bed clean that I am able to visualize Drainage (amount, consistency, odor) none noted Periwound: intact Dressing procedure/placement/frequency: silicone foam initiated at bedside for protection and insulation of this area until skin flap sloughs or reattaches.  No other needs for the toes at this time.   Re consult if needed, will not follow at this time. Thanks  Merial Moritz [employer], CWOCN [phone])

## 2011-10-01 NOTE — Progress Notes (Signed)
Utilization review complete 

## 2011-10-02 DIAGNOSIS — R609 Edema, unspecified: Secondary | ICD-10-CM

## 2011-10-02 DIAGNOSIS — F329 Major depressive disorder, single episode, unspecified: Secondary | ICD-10-CM

## 2011-10-02 LAB — BASIC METABOLIC PANEL
Calcium: 9.9 mg/dL (ref 8.4–10.5)
Creatinine, Ser: 1.29 mg/dL — ABNORMAL HIGH (ref 0.50–1.10)
GFR calc non Af Amer: 37 mL/min — ABNORMAL LOW (ref >90)
Glucose, Bld: 147 mg/dL — ABNORMAL HIGH (ref 70–99)
Sodium: 143 mEq/L (ref 135–145)

## 2011-10-02 LAB — CBC
MCH: 31.8 pg (ref 26.0–34.0)
MCHC: 34.4 g/dL (ref 30.0–36.0)
MCV: 92.4 fL (ref 78.0–100.0)
Platelets: 148 10*3/uL — ABNORMAL LOW (ref 150–400)

## 2011-10-02 LAB — MRSA PCR SCREENING: MRSA by PCR: NEGATIVE

## 2011-10-02 NOTE — Clinical Social Work Psychosocial (Signed)
Clinical Social Work Department BRIEF PSYCHOSOCIAL ASSESSMENT 10/02/2011  Patient:  ZANYLA, KLEBBA     Account Number:  [id number]     Admit date:  09/30/2011  Clinical Social Worker:  Delmer Islam  Date/Time:  10/02/2011 08:19 AM  Referred by:  Physician  Date Referred:  10/02/2011 Referred for  ALF Placement   Other Referral:   Interview type:  Family Other interview type:    PSYCHOSOCIAL DATA Living Status:  FACILITY Admitted from facility:   Level of care:  Assisted Living Primary support name:  [name] Primary support relationship to patient:  CHILD, ADULT Degree of support available:   Strong support. Patient from [facility]  Daughter and son-in-law are Marylu Lund and Orson Ape. Daughter's cell is [phone].    CURRENT CONCERNS Current Concerns  Post-Acute Placement   Other Concerns:    SOCIAL WORK ASSESSMENT / PLAN CSW talked with daughter about patient discharging back to facility and this is the plan per daughter. CSW given the phone numbers for the RN Anette Riedel and the executive director Fanny Dance. Daughter indicated that she would provide transport back to facility.   Assessment/plan status:  No Further Intervention Required Other assessment/ plan:   Information/referral to community resources:    PATIENT'S/FAMILY'S RESPONSE TO PLAN OF CARE: Daughter appreciative of CSW's assistance in getting patient back to [facility].

## 2011-10-02 NOTE — Progress Notes (Addendum)
Attempted to call report to [facility], left message on answering machine. Pt is ready for discharge. Discharge packet given to daughter. IV removed. Tele removed. Assessment unchanged from morning. Pt being transported to facility by daughter in daughter's car.

## 2011-10-02 NOTE — Discharge Summary (Signed)
Physician Discharge Summary  Angela [name]:[id number] DOB: [birth year] DOA: 09/30/2011  PCP: Crawford Givens, MD  Admit date: 09/30/2011 Discharge date: 10/02/2011  Discharge Diagnoses:   ARF (acute renal failure), prerenal.   DIABETES MELLITUS, TYPE II  HYPERCHOLESTEROLEMIA, MIXED  SYMPTOM, MEMORY LOSS  CAD (coronary artery disease)  Hyperkalemia  Discharge Condition: Stable.   Disposition: Transfer to Memory unit. Patient will need hospice/palliative care consult.   Diet: Regular diet.   History of present illness:  Angela [name] is an 78 y.o. female with hx of advanced dementia, generally agitated, with DM, HTN, Hyperlipidemia, known CAD, macular degeneration, fell today and was brought to the ER. He daughters were at her bedside and had comptemplated no hospitalization and no treatment originally. After taking sometime to think thru, they are agreeable to have her admitted and treatment of her hyperkalemia ( K=5.6) and ARF (Cr 2.73-baseline 1.4). Family told me she has been very combative and tries to bite people as well. She is a DNR and family has been seriously comptemplating CMO and "DO NOT HOSPITALIZE" status. Hospitalist was asked to admit patient for acute renal failure and hyperkalemia.   Hospital Course:  DIABETES MELLITUS, TYPE II  HYPERCHOLESTEROLEMIA, MIXED  SYMPTOM, MEMORY LOSS  CAD (coronary artery disease)  ARF (acute renal failure)  Hyperkalemia   1. ARF (acute renal failure): Patient presented with creatinine of 2.73 and BUN 74, against a background of known baseline creatinine of 1.4. This is consistent with ARF, due to dehydration and poor oral intake, possibly occasioned by dementia. Renal function has improved with  iv fluids, and creatinine has decrease to 1.29. Further improvement is anticipated. Hyperkalemia due to ARF, was addressed with Calcium gluconate, D50 and IV Insulin and Kayexalate, and has already resolved. I will discharge off Diuretic  and ACE (HCTZ and lisinopril). Patient at high risk for readmission, discussed case with daughter she is contemplating most form. Please consider hospice/palliative care consult at the facility.   2. Dementia: Patient has advanced dementia, and is combative at baseline. She will be continued on pre-admission psychotropics.  3. CAD (coronary artery disease): Stable?Asymptomatic.  4. HYPERCHOLESTEROLEMIA, MIXED: On statin.  5. DIABETES MELLITUS, TYPE II: Managing with SSI. CBGs are controlled.  6. Right foot wound: This does not appear infected. For local care. silicone foam initiated at bedside for protection and insulation of this area until skin flap sloughs or reattaches.        Discharge Exam: Filed Vitals:   10/02/11 1339  BP: 136/68  Pulse: 67  Temp: 98.2 F (36.8 C)  Resp: 18   Filed Vitals:   10/01/11 1800 10/01/11 2322 10/02/11 0810 10/02/11 1339  BP: 134/82 134/55 140/57 136/68  Pulse: 68 68 72 67  Temp: 97.3 F (36.3 C) 99.1 F (37.3 C) 97.4 F (36.3 C) 98.2 F (36.8 C)  TempSrc: Oral Oral Oral Oral  Resp: 18  18 18   Height:      Weight:  73.7 kg (162 lb 7.7 oz)    SpO2: 96% 95% 96% 97%   General: No distress.  Cardiovascular: S1, S2 RRR.  Respiratory: CTA.   Discharge Instructions  Discharge Orders    Future Orders Please Complete By Expires   Diet - low sodium heart healthy      Increase activity slowly        Medication List  As of 10/02/2011  1:54 PM   STOP taking these medications         lisinopril-hydrochlorothiazide 20-12.5 MG  per tablet         TAKE these medications         amLODipine 10 MG tablet   Commonly known as: NORVASC   Take 1 tablet (10 mg total) by mouth daily.      aspirin 81 MG chewable tablet   Chew 81 mg by mouth daily.      atorvastatin 20 MG tablet   Commonly known as: LIPITOR   Take 20 mg by mouth every morning.      cholecalciferol 1000 UNITS tablet   Commonly known as: VITAMIN D   Take 1 tablet (1,000 Units  total) by mouth 2 (two) times daily.      cloNIDine 0.2 MG tablet   Commonly known as: CATAPRES   Take 0.2 mg by mouth 2 (two) times daily.      clopidogrel 75 MG tablet   Commonly known as: PLAVIX   Take 1 tablet (75 mg total) by mouth daily.      Cranberry 450 MG Caps   Take 1 tablet by mouth daily.      docusate sodium 100 MG capsule   Commonly known as: COLACE   Take 100 mg by mouth 2 (two) times daily as needed. For stool softner      levETIRAcetam 500 MG tablet   Commonly known as: KEPPRA   Take 500 mg by mouth 2 (two) times daily.      LORazepam 0.5 MG tablet   Commonly known as: ATIVAN   Take 0.25 mg by mouth every 8 (eight) hours as needed. For aggitation      MAPAP 325 MG tablet   Generic drug: acetaminophen   Take 650 mg by mouth every 6 (six) hours as needed. For pain      metoprolol 50 MG tablet   Commonly known as: LOPRESSOR   Take 1 tablet (50 mg total) by mouth 2 (two) times daily.      mirtazapine 15 MG tablet   Commonly known as: REMERON   Take 7.5 mg by mouth at bedtime.      multivitamin with minerals Tabs   Take 1 tablet by mouth daily.      nitroGLYCERIN 0.4 MG SL tablet   Commonly known as: NITROSTAT   Place 0.4 mg under the tongue every 5 (five) minutes as needed. For chest pain      oxyCODONE-acetaminophen 5-325 MG per tablet   Commonly known as: PERCOCET/ROXICET   Take 1 tablet by mouth every 8 (eight) hours as needed for pain.      ranitidine 150 MG capsule   Commonly known as: ZANTAC   Take 150 mg by mouth every morning.      trimethoprim 100 MG tablet   Commonly known as: TRIMPEX   Take 100 mg by mouth daily.              The results of significant diagnostics from this hospitalization (including imaging, microbiology, ancillary and laboratory) are listed below for reference.    Significant Diagnostic Studies: Ct Head Wo Contrast  09/30/2011  *RADIOLOGY REPORT*  Clinical Data: Fall out of wheelchair.  CT HEAD WITHOUT  CONTRAST  Technique:  Contiguous axial images were obtained from the base of the skull through the vertex without contrast.  Comparison: 11/06/2007  Findings: The brain shows advanced atrophy and small vessel ischemic changes.  Stable appearance of an old white matter infarct in the left frontal lobe with associated dilatation of the adjacent lateral ventricle.  The  brain demonstrates no evidence of hemorrhage, acute cortical infarction, edema, mass effect, extra-axial fluid collection, hydrocephalus or mass lesion.  The skull is unremarkable.  IMPRESSION: No acute findings.  Atrophy, small vessel disease and old left frontal infarct.  Original Report Authenticated By: Reola Calkins, M.D.   Ct Foot Right Wo Contrast  09/27/2011  *RADIOLOGY REPORT*  Clinical Data: Foot pain and swelling since an injury 6 days ago. Question fracture.  CT OF THE RIGHT FOOT WITHOUT CONTRAST  Technique:  Multidetector CT imaging was performed according to the standard protocol. Multiplanar CT image reconstructions were also generated.  Comparison: Plain films 09/22/2011.  Findings: Extensive subcutaneous edema is seen about the foot. There is a focal area of high attenuation in the dorsal subcutaneous tissues consistent with contusion overlying the distal first metatarsal.  The contusion measures 3.0 cm in long by 1.1 cm cranial-caudal by 2.9 cm transverse.  There is no underlying fracture.  Mild first MTP degenerative change is noted.  Visualized ligaments and tendons appear intact.  IMPRESSION: Soft tissue edema over the dorsum of the foot with a focal hematoma over the distal first metatarsal.  Negative for fracture. Specifically, there is no fracture of the distal tibia.  Original Report Authenticated By: Bernadene Bell. Maricela Curet, M.D.   US Renal Port  10/01/2011  *RADIOLOGY REPORT*  Clinical Data: Acute renal failure.  RENAL/URINARY TRACT ULTRASOUND COMPLETE  Comparison:  None  Findings:  Right Kidney:  7.8 cm in length.  Mild  renal cortical thinning is noted.  No hydronephrosis.  A small cyst is demonstrated.  Left Kidney:  11.4 cm in length.  Normal renal echogenicity.  Mild renal cortical thinning.  No hydronephrosis.  A 4.9 x 5.2 x 4.4 cm mid pole cyst is noted.  Bladder:  Normal.  IMPRESSION:  1.  Mild renal cortical thinning but no hydronephrosis. 2.  Bilateral cysts.  Original Report Authenticated By: P. Loralie Champagne, M.D.   Dg Ankle Right Port  09/22/2011  *RADIOLOGY REPORT*  Clinical Data: Fall, lateral ankle pain.  PORTABLE RIGHT ANKLE - 2 VIEW  Comparison: Contemporaneous foot  Findings: No fractures identified however limited positioning due to patient discomfort.  Diffuse soft tissue swelling about the ankle and dorsal soft tissue swelling overlies the foot. Osteopenia.  IMPRESSION: No fracture demonstrated, may have been artifactual on the contemporaneous foot radiographs.If clinical concern for a fracture persists, recommend a repeat radiograph in 5-10 days to evaluate for interval change or callus formation.  Original Report Authenticated By: Waneta Martins, M.D.   Dg Foot Complete Right  09/22/2011  *RADIOLOGY REPORT*  Clinical Data: Fall, abrasion over the first digit.  RIGHT FOOT COMPLETE - 3+ VIEW  Comparison: None.  Findings: Diffuse osteopenia.  There is evidence of a distal tibial fracture. Intact Lisfranc joint.  No additional fracture or dislocation.  Soft tissue fullness of the first metatarsal phalangeal joint with mild hallux valgus.  IMPRESSION: Distal tibial fracture is age indeterminate.  Correlate with point tenderness.  Mild first digit hallux valgus deformity.  Original Report Authenticated By: Waneta Martins, M.D.    Microbiology: Recent Results (from the past 240 hour(s))  MRSA PCR SCREENING     Status: Normal   Collection Time   10/01/11 11:16 PM      Component Value Range Status Comment   MRSA by PCR NEGATIVE  NEGATIVE Final      Labs: Basic Metabolic Panel:  Lab 10/02/11  0645 10/01/11 0555 09/30/11 2223  NA 143 145  141  K 4.1 4.7 --  CL 116* 115* 112  CO2 17* 16* 14*  GLUCOSE 147* 99 121*  BUN 32* 64* 74*  CREATININE 1.29* 2.14* 2.73*  CALCIUM 9.9 10.3 10.8*  MG -- -- --  PHOS -- -- --   CBC:  Lab 10/02/11 0645 10/01/11 0555 09/30/11 2223  WBC 4.7 5.0 6.1  NEUTROABS -- -- 4.5  HGB 10.4* 9.3* 10.8*  HCT 30.2* 27.8* 32.1*  MCV 92.4 91.7 92.0  PLT 148* 156 165   CBG:  Lab 10/01/11 0217  GLUCAP 136*    Time coordinating discharge: 30 minutes.   SignedHartley Barefoot  [facility] 10/02/2011, 1:54 PM

## 2011-10-02 NOTE — Clinical Social Work Note (Signed)
Clinical information faxed to facility and approved by Executive Director Tammy Daphine Deutscher. Patient transported today to [facility] by daughter who was given packet for nursing.  Genelle Bal, MSW, LCSW [phone]

## 2011-10-04 NOTE — ED Provider Notes (Signed)
Medical screening examination/treatment/procedure(s) were performed by non-physician practitioner and as supervising physician I was immediately available for consultation/collaboration.  Raeford Razor, MD 10/04/11 [phone]

## 2011-10-13 ENCOUNTER — Observation Stay (HOSPITAL_COMMUNITY)
Admission: EM | Admit: 2011-10-13 | Discharge: 2011-10-14 | DRG: 690 | Disposition: A | Discharge status: Nursing Home (Includes ICF) | Payer: Medicare Other | Attending: Internal Medicine | Admitting: Internal Medicine

## 2011-10-13 ENCOUNTER — Emergency Department (HOSPITAL_COMMUNITY): Payer: Medicare Other

## 2011-10-13 ENCOUNTER — Encounter (HOSPITAL_COMMUNITY): Payer: Self-pay | Admitting: Emergency Medicine

## 2011-10-13 DIAGNOSIS — R079 Chest pain, unspecified: Secondary | ICD-10-CM | POA: Diagnosis present

## 2011-10-13 DIAGNOSIS — F0390 Unspecified dementia without behavioral disturbance: Secondary | ICD-10-CM | POA: Diagnosis present

## 2011-10-13 DIAGNOSIS — Z9181 History of falling: Secondary | ICD-10-CM

## 2011-10-13 DIAGNOSIS — Z8673 Personal history of transient ischemic attack (TIA), and cerebral infarction without residual deficits: Secondary | ICD-10-CM

## 2011-10-13 DIAGNOSIS — N189 Chronic kidney disease, unspecified: Secondary | ICD-10-CM | POA: Diagnosis present

## 2011-10-13 DIAGNOSIS — E860 Dehydration: Secondary | ICD-10-CM | POA: Diagnosis present

## 2011-10-13 DIAGNOSIS — K219 Gastro-esophageal reflux disease without esophagitis: Secondary | ICD-10-CM | POA: Diagnosis present

## 2011-10-13 DIAGNOSIS — F068 Other specified mental disorders due to known physiological condition: Secondary | ICD-10-CM | POA: Diagnosis present

## 2011-10-13 DIAGNOSIS — E785 Hyperlipidemia, unspecified: Secondary | ICD-10-CM | POA: Diagnosis present

## 2011-10-13 DIAGNOSIS — R1013 Epigastric pain: Secondary | ICD-10-CM | POA: Diagnosis present

## 2011-10-13 DIAGNOSIS — D638 Anemia in other chronic diseases classified elsewhere: Secondary | ICD-10-CM | POA: Diagnosis present

## 2011-10-13 DIAGNOSIS — I129 Hypertensive chronic kidney disease with stage 1 through stage 4 chronic kidney disease, or unspecified chronic kidney disease: Secondary | ICD-10-CM | POA: Diagnosis present

## 2011-10-13 DIAGNOSIS — E7800 Pure hypercholesterolemia, unspecified: Secondary | ICD-10-CM

## 2011-10-13 DIAGNOSIS — I10 Essential (primary) hypertension: Secondary | ICD-10-CM | POA: Diagnosis present

## 2011-10-13 DIAGNOSIS — N390 Urinary tract infection, site not specified: Principal | ICD-10-CM | POA: Diagnosis present

## 2011-10-13 DIAGNOSIS — Z79899 Other long term (current) drug therapy: Secondary | ICD-10-CM

## 2011-10-13 DIAGNOSIS — Z66 Do not resuscitate: Secondary | ICD-10-CM | POA: Diagnosis present

## 2011-10-13 DIAGNOSIS — N179 Acute kidney failure, unspecified: Secondary | ICD-10-CM | POA: Diagnosis present

## 2011-10-13 DIAGNOSIS — I2510 Atherosclerotic heart disease of native coronary artery without angina pectoris: Secondary | ICD-10-CM | POA: Diagnosis present

## 2011-10-13 DIAGNOSIS — E119 Type 2 diabetes mellitus without complications: Secondary | ICD-10-CM | POA: Diagnosis present

## 2011-10-13 LAB — GLUCOSE, CAPILLARY
Glucose-Capillary: 109 mg/dL — ABNORMAL HIGH (ref 70–99)
Glucose-Capillary: 159 mg/dL — ABNORMAL HIGH (ref 70–99)

## 2011-10-13 LAB — CBC WITH DIFFERENTIAL/PLATELET
Eosinophils Absolute: 0.3 10*3/uL (ref 0.0–0.7)
HCT: 29.3 % — ABNORMAL LOW (ref 36.0–46.0)
Lymphocytes Relative: 18 % (ref 12–46)
Lymphs Abs: 1 10*3/uL (ref 0.7–4.0)
MCHC: 33.8 g/dL (ref 30.0–36.0)
MCV: 92.1 fL (ref 78.0–100.0)
Monocytes Relative: 14 % — ABNORMAL HIGH (ref 3–12)
Platelets: 189 10*3/uL (ref 150–400)
RDW: 14.4 % (ref 11.5–15.5)
WBC: 5.4 10*3/uL (ref 4.0–10.5)

## 2011-10-13 LAB — COMPREHENSIVE METABOLIC PANEL
Alkaline Phosphatase: 79 U/L (ref 39–117)
BUN: 25 mg/dL — ABNORMAL HIGH (ref 6–23)
Chloride: 108 mEq/L (ref 96–112)
GFR calc Af Amer: 33 mL/min — ABNORMAL LOW (ref >90)
GFR calc non Af Amer: 29 mL/min — ABNORMAL LOW (ref >90)
Glucose, Bld: 111 mg/dL — ABNORMAL HIGH (ref 70–99)
Potassium: 3.9 mEq/L (ref 3.5–5.1)
Total Bilirubin: 0.3 mg/dL (ref 0.3–1.2)

## 2011-10-13 LAB — POCT I-STAT TROPONIN I

## 2011-10-13 LAB — CARDIAC PANEL(CRET KIN+CKTOT+MB+TROPI)
CK, MB: 2.3 ng/mL (ref 0.3–4.0)
Relative Index: INVALID (ref 0.0–2.5)
Troponin I: <0.3 ng/mL (ref <0.30)

## 2011-10-13 LAB — URINALYSIS, ROUTINE W REFLEX MICROSCOPIC
Ketones, ur: NEGATIVE mg/dL
Nitrite: NEGATIVE
Protein, ur: 30 mg/dL — AB

## 2011-10-13 MED ORDER — ONDANSETRON HCL 4 MG/2ML IJ SOLN
4.0000 mg | Freq: Four times a day (QID) | INTRAMUSCULAR | Status: DC | PRN
  Administered 2011-10-13: 4 mg via INTRAVENOUS
  Filled 2011-10-13: qty 2

## 2011-10-13 MED ORDER — ACETAMINOPHEN 650 MG RE SUPP: 650.0000 mg | Freq: Four times a day (QID) | RECTAL | Status: DC | PRN

## 2011-10-13 MED ORDER — METOPROLOL TARTRATE 25 MG PO TABS
25.0000 mg | ORAL_TABLET | Freq: Two times a day (BID) | ORAL | Status: DC
  Administered 2011-10-13: 25 mg via ORAL
  Filled 2011-10-13 (×3): qty 1

## 2011-10-13 MED ORDER — CRANBERRY 405 MG PO CAPS: 1.0000 | ORAL_CAPSULE | Freq: Every day | ORAL | Status: DC

## 2011-10-13 MED ORDER — VITAMIN D3 25 MCG (1000 UNIT) PO TABS
1000.0000 [IU] | ORAL_TABLET | Freq: Two times a day (BID) | ORAL | Status: DC
  Administered 2011-10-13: 1000 [IU] via ORAL
  Filled 2011-10-13 (×3): qty 1

## 2011-10-13 MED ORDER — MIRTAZAPINE 7.5 MG PO TABS
7.5000 mg | ORAL_TABLET | Freq: Every day | ORAL | Status: DC
  Administered 2011-10-13: 7.5 mg via ORAL
  Filled 2011-10-13 (×2): qty 1

## 2011-10-13 MED ORDER — LEVETIRACETAM 500 MG PO TABS
500.0000 mg | ORAL_TABLET | Freq: Two times a day (BID) | ORAL | Status: DC
  Administered 2011-10-13: 500 mg via ORAL
  Filled 2011-10-13 (×3): qty 1

## 2011-10-13 MED ORDER — CLONIDINE HCL 0.2 MG PO TABS
0.2000 mg | ORAL_TABLET | Freq: Two times a day (BID) | ORAL | Status: DC
  Administered 2011-10-13: 0.2 mg via ORAL
  Filled 2011-10-13 (×3): qty 1

## 2011-10-13 MED ORDER — OXYCODONE-ACETAMINOPHEN 5-325 MG PO TABS
1.0000 | ORAL_TABLET | Freq: Three times a day (TID) | ORAL | Status: DC | PRN
  Administered 2011-10-13: 1 via ORAL
  Filled 2011-10-13: qty 1

## 2011-10-13 MED ORDER — SODIUM CHLORIDE 0.9 % IV SOLN: INTRAVENOUS | Status: DC

## 2011-10-13 MED ORDER — ATORVASTATIN CALCIUM 20 MG PO TABS
20.0000 mg | ORAL_TABLET | Freq: Every day | ORAL | Status: DC
  Filled 2011-10-13: qty 1

## 2011-10-13 MED ORDER — ASPIRIN 81 MG PO CHEW
81.0000 mg | CHEWABLE_TABLET | Freq: Every day | ORAL | Status: DC
Start: 2011-10-14 — End: 2011-10-14
  Filled 2011-10-13: qty 1

## 2011-10-13 MED ORDER — NITROGLYCERIN 0.4 MG SL SUBL
0.4000 mg | SUBLINGUAL_TABLET | SUBLINGUAL | Status: DC | PRN
Start: 2011-10-13 — End: 2011-10-14

## 2011-10-13 MED ORDER — INSULIN ASPART 100 UNIT/ML ~~LOC~~ SOLN: 0.0000 [IU] | Freq: Three times a day (TID) | SUBCUTANEOUS | Status: DC

## 2011-10-13 MED ORDER — ACETAMINOPHEN 325 MG PO TABS
650.0000 mg | ORAL_TABLET | Freq: Four times a day (QID) | ORAL | Status: DC | PRN
Start: 2011-10-13 — End: 2011-10-14

## 2011-10-13 MED ORDER — AMLODIPINE BESYLATE 10 MG PO TABS
10.0000 mg | ORAL_TABLET | Freq: Every day | ORAL | Status: DC
  Administered 2011-10-13: 10 mg via ORAL
  Filled 2011-10-13 (×2): qty 1

## 2011-10-13 MED ORDER — CEFTRIAXONE SODIUM 1 G IJ SOLR: 1.0000 g | INTRAMUSCULAR | Status: DC

## 2011-10-13 MED ORDER — CLOPIDOGREL BISULFATE 75 MG PO TABS
75.0000 mg | ORAL_TABLET | Freq: Every day | ORAL | Status: DC
  Filled 2011-10-13 (×2): qty 1

## 2011-10-13 MED ORDER — DEXTROSE 5 % IV SOLN
1.0000 g | INTRAVENOUS | Status: DC
  Administered 2011-10-13: 1 g via INTRAVENOUS
  Filled 2011-10-13 (×2): qty 10

## 2011-10-13 MED ORDER — ADULT MULTIVITAMIN W/MINERALS CH
1.0000 | ORAL_TABLET | Freq: Every day | ORAL | Status: DC
Start: 2011-10-14 — End: 2011-10-14
  Filled 2011-10-13: qty 1

## 2011-10-13 MED ORDER — PANTOPRAZOLE SODIUM 40 MG PO TBEC
40.0000 mg | DELAYED_RELEASE_TABLET | Freq: Every day | ORAL | Status: DC
  Filled 2011-10-13: qty 1

## 2011-10-13 MED ORDER — LORAZEPAM 0.5 MG PO TABS
0.2500 mg | ORAL_TABLET | Freq: Two times a day (BID) | ORAL | Status: DC | PRN
  Administered 2011-10-13 – 2011-10-14 (×2): 0.25 mg via ORAL
  Filled 2011-10-13 (×2): qty 1

## 2011-10-13 MED ORDER — ONDANSETRON HCL 4 MG PO TABS: 4.0000 mg | ORAL_TABLET | Freq: Four times a day (QID) | ORAL | Status: DC | PRN

## 2011-10-13 NOTE — Progress Notes (Addendum)
PHARMACIST - PHYSICIAN ORDER COMMUNICATION  CONCERNING: [employer] Medication Policy on Herbal Medications  DESCRIPTION:  This patient's order for cranberry capsules has been noted.  This product is classified as an "herbal" or natural product.  The Pharmacy and Therapeutics Committee does not permit the use of "herbal" or natural products of this type within [facility].  This policy was adopted because of the lack of definitive safety studies, FDA approval, nonstandard manufacturing practices, and the potential risk of unknown drug-drug interactions with inpatient medications.   ACTION TAKEN: The pharmacy department is has discontinued this order.   Your patient has been informed of this safety policy.  Please reevaluate patient's clinical condition at discharge and determine whether or not the cranberry capsules should be resumed at that time.  Polo Riley R.Ph. 10/13/2011 5:31 PM

## 2011-10-13 NOTE — H&P (Signed)
[facility] History and Physical  Angela [name] QMV:[id number] DOB: 1925-12-10 DOA: 10/13/2011  Referring physician: Chaney Malling; Md PCP: Crawford Givens, MD   Chief Complaint: Unwitnessed fall, chest discomfort  HPI:  78 year old female with a past medical history significant for hypertension, diabetes type 2 (controlled with diet), dementia, chronic renal failure (stage 2-3) and coronary artery disease; was brought to the major department from her facility secondary to unwitnessed fall. Patient in the Ed also endorses some mid chest/epigastric pain (which resolved on his own). In the ED a CT of the head and cervical spine demonstrated no acute intracranial process or fracture. She was found with acute on chronic renal failure and UTI. [facility] has been called to admit the patient for further evaluation and treatment.  Review of Systems:  Negative except as mentioned on history of present illness.  Past Medical History  Diagnosis Date  . Diabetes mellitus 06/2005    Type II  . Hypertension 1960  . Hyperlipidemia 02/2004  . Memory loss     history of dementia  . CVA (cerebral infarction)     x 3  . Stroke 2001  . Echocardiogram abnormal     a. 09/2003 EF 55-65%, Mild AR;  b. 05/2008 Echo - EF 66-70%, no RWMA, mid-cavity obliteration @ end systole, DD, mild AI/MR/TR, NL RV  . MRI of brain abnormal 09/15/2003    Old lunar infarct, tight L MVERT ART stenosis 40% RICA  . TIA (transient ischemic attack) 11/2007  . Pneumonia 11/2007  . Abnormal head CT 11/06/2007    Old L hemispheric stroke scattered white matter Dz no acute changes  . Abnormal head MRI 12/08/2007    No acute changes  . Abnormal MRA, head 11/08/2007    Stenosis dist R carotid <50% RICA 75-80% LICA <50%  . Chest pain 10/2008    R/O'd Epig. pain, shaking after Morphine  . Cellulitis 07/2009    RLL  . Macular degeneration   . CAD (coronary artery disease)     a. 04/2004 - PCI LAD - cypher DES to prox LAD, Vision  BMS to mid-LAD, PTCA to D1;  b. 07/2004 - cath/PCI : LM-NL, LAD 75m ISR (treated with CBA), 70d, LCX minor irrges, RCA 20p, 30d;  c. 05/2008 Aden MV: EF 72%, no ischemia  . Dementia    Past Surgical History  Procedure Date  . Appendectomy     18 YOA  . Shoulder surgery 2004    Left  . Adenosine myoview 03/08/2004    Mild isch EF 72%  . Cardiac catheterization 04/09/2004    Severe single vessel disease, tx medically  . Cardiac catheterization 05/02/2004    PTCA X 3  . Instent restenosis 5/10 - 07/28/2004    [facility]  . Cardiac catheterization 07/19/2004    EF 65% Instent restenosis PTCA  . Abdominal ct 07/20/2004    Incr in retroperitoneal hemm  . Adenosine myoview 10/17/2004    Mild isch dist ant wall EF 69%  . Adenosine myoview 09/10/2005    Low risk  . Eeg     Pos left frontal temp focus  . Adenosine cardiolite 09/16/2006    No change, normal  . Carotid duplex, bilateral 11/09/2007    40-60% ICA Stenosis L>R  . Modified barium swallow 11/09/2007    No aspiration  . Technetium myoview 05/11/08    Normal  . Lower extremity US 10/26/08    No DVT  . Cataract    Social History:  reports that she has never smoked. She has never used smokeless tobacco. She reports that she does not drink alcohol or use illicit drugs. From [facility] unit; require assistance with ADL's; planning to return to same facility at discharge.    Allergies  Allergen Reactions  . Morphine Sulfate     REACTION: shaking    Family History  Problem Relation Age of Onset  . Aneurysm Mother   . Alcohol abuse Father   . Learning disabilities Sister   . Heart disease Sister     CAD  . Heart disease Brother   . Alcohol abuse Brother   . Alcohol abuse Brother   . Heart disease Brother     MI x 2    Prior to Admission medications   Medication Sig Start Date End Date Taking? Authorizing Provider  amLODipine (NORVASC) 10 MG tablet Take 1 tablet (10 mg total) by mouth daily. 05/14/11 05/13/12 Yes Ok Anis,  NP  aspirin 81 MG chewable tablet Chew 81 mg by mouth daily.   Yes Historical Provider, MD  atorvastatin (LIPITOR) 20 MG tablet Take 20 mg by mouth every morning. 06/25/11 06/24/12 Yes Antonieta Iba, MD  cholecalciferol (VITAMIN D) 1000 UNITS tablet Take 1 tablet (1,000 Units total) by mouth 2 (two) times daily. 02/11/11  Yes Joaquim Nam, MD  cloNIDine (CATAPRES) 0.2 MG tablet Take 0.2 mg by mouth 2 (two) times daily.   Yes Historical Provider, MD  clopidogrel (PLAVIX) 75 MG tablet Take 1 tablet (75 mg total) by mouth daily. 03/15/11  Yes Joaquim Nam, MD  Cranberry 405 MG CAPS Take 1 capsule by mouth daily.   Yes Historical Provider, MD  docusate sodium (COLACE) 100 MG capsule Take 100 mg by mouth 2 (two) times daily as needed. For stool softner   Yes Historical Provider, MD  levETIRAcetam (KEPPRA) 500 MG tablet Take 500 mg by mouth 2 (two) times daily.   Yes Historical Provider, MD  metoprolol tartrate (LOPRESSOR) 25 MG tablet Take 25 mg by mouth 2 (two) times daily.   Yes Historical Provider, MD  mirtazapine (REMERON) 15 MG tablet Take 7.5 mg by mouth at bedtime.   Yes Historical Provider, MD  Multiple Vitamin (MULTIVITAMIN WITH MINERALS) TABS Take 1 tablet by mouth daily.   Yes Historical Provider, MD  nitroGLYCERIN (NITROSTAT) 0.4 MG SL tablet Place 0.4 mg under the tongue every 5 (five) minutes as needed. For chest pain   Yes Historical Provider, MD  oxyCODONE-acetaminophen (PERCOCET/ROXICET) 5-325 MG per tablet Take 1 tablet by mouth every 8 (eight) hours as needed. For pain.   Yes Historical Provider, MD  ranitidine (ZANTAC) 150 MG capsule Take 150 mg by mouth every morning.    Yes Historical Provider, MD  trimethoprim (TRIMPEX) 100 MG tablet Take 100 mg by mouth daily.   Yes Historical Provider, MD   Physical Exam: Filed Vitals:   10/13/11 1030 10/13/11 1200 10/13/11 1330  BP: 138/66 164/58 151/49  Pulse: 77    Temp: 97.9 F (36.6 C)    Resp: 22 13 12   SpO2: 100%        General:  No acute distress, oriented only to person (baseline do to dementia); able to follow commands.  Eyes: PERRLA, no icterus and no nystagmus  Neck: No JVD, no bruits or thyromegaly appreciated  Cardiovascular: S1 and S2 appreciated, positive systolic murmur, no gallops.  Respiratory: Clear to auscultation bilaterally  Abdomen: Soft, nontender, nondistended, positive bowel sounds  Skin: Multiple bruises  especially involving her legs arms and on her back from previous falls. There is also an open wound in healing process without signs of infection of her right foot dorsal aspect.  Musculoskeletal: No joint swelling appreciated on exam; extremities with 1-2+ chronic edema bilaterally.  Psychiatric: Stable and appropriate to situation.  Neurologic: Alert, awake and oriented x1; there grossly intact; no focal motor or sensory deficit appreciated.  Labs on Admission:  Basic Metabolic Panel:  Lab 10/13/11 4098  NA 139  K 3.9  CL 108  CO2 19  GLUCOSE 111*  BUN 25*  CREATININE 1.59*  CALCIUM 9.8  MG --  PHOS --   Liver Function Tests:  Lab 10/13/11 1043  AST 20  ALT 12  ALKPHOS 79  BILITOT 0.3  PROT 6.7  ALBUMIN 3.5   CBC:  Lab 10/13/11 1043  WBC 5.4  NEUTROABS 3.3  HGB 9.9*  HCT 29.3*  MCV 92.1  PLT 189    Radiological Exams on Admission: Ct Head Wo Contrast  10/13/2011  *RADIOLOGY REPORT*  Clinical Data:  Dementia.  History of fall.  Potential trauma.  CT HEAD WITHOUT CONTRAST CT CERVICAL SPINE WITHOUT CONTRAST  Technique:  Multidetector CT imaging of the head and cervical spine was performed following the standard protocol without intravenous contrast.  Multiplanar CT image reconstructions of the cervical spine were also generated.  Comparison:  Head CT 09/30/2011.  CT HEAD  Findings: No acute displaced skull fractures are identified.  The cerebral and cerebellar atrophy is again noted.  Large lacunar infarct in the left basal ganglia, with multiple  other smaller lacunar infarcts in the basal ganglia bilaterally.  Extensive patchy and confluent areas of decreased attenuation throughout the deep and periventricular white matter of the cerebral hemispheres bilaterally, compatible chronic microvascular ischemic disease.  No definite acute intracranial abnormality.  Specifically, no definite signs of acute post-traumatic intracranial hemorrhage, no area of acute/subacute cerebral ischemia, no focal mass, mass effect, hydrocephalus or abnormal intra or extra-axial fluid collections. Visualized paranasal sinuses and mastoids are well pneumatized.  IMPRESSION: 1.  No acute displaced skull fracture or findings to suggest acute intracranial abnormalities. 2.  The appearance of the brain is essentially unchanged compared to the recent prior studies 09/30/2011, as detailed above.  CT CERVICAL SPINE  Findings: No acute displaced cervical spine fractures are identified.  There is severe multilevel degenerative disc disease, most pronounced at C3-C4, C4-C5 and C5-C6.  Multilevel facet arthropathy is also noted.  Prevertebral soft tissues are normal. Mild reversal normal cervical lordosis centered at the level of C4, related to underlying chronic degenerative changes, as above. Visualized portions of the upper thorax are unremarkable.  IMPRESSION: 1.  No evidence of significant acute traumatic injury to the cervical spine. 2.  Extensive multilevel degenerative disc disease and cervical spondylosis, as above.  Original Report Authenticated By: Florencia Reasons, M.D.   Ct Cervical Spine Wo Contrast  10/13/2011  *RADIOLOGY REPORT*  Clinical Data:  Dementia.  History of fall.  Potential trauma.  CT HEAD WITHOUT CONTRAST CT CERVICAL SPINE WITHOUT CONTRAST  Technique:  Multidetector CT imaging of the head and cervical spine was performed following the standard protocol without intravenous contrast.  Multiplanar CT image reconstructions of the cervical spine were also generated.   Comparison:  Head CT 09/30/2011.  CT HEAD  Findings: No acute displaced skull fractures are identified.  The cerebral and cerebellar atrophy is again noted.  Large lacunar infarct in the left basal ganglia, with multiple other smaller  lacunar infarcts in the basal ganglia bilaterally.  Extensive patchy and confluent areas of decreased attenuation throughout the deep and periventricular white matter of the cerebral hemispheres bilaterally, compatible chronic microvascular ischemic disease.  No definite acute intracranial abnormality.  Specifically, no definite signs of acute post-traumatic intracranial hemorrhage, no area of acute/subacute cerebral ischemia, no focal mass, mass effect, hydrocephalus or abnormal intra or extra-axial fluid collections. Visualized paranasal sinuses and mastoids are well pneumatized.  IMPRESSION: 1.  No acute displaced skull fracture or findings to suggest acute intracranial abnormalities. 2.  The appearance of the brain is essentially unchanged compared to the recent prior studies 09/30/2011, as detailed above.  CT CERVICAL SPINE  Findings: No acute displaced cervical spine fractures are identified.  There is severe multilevel degenerative disc disease, most pronounced at C3-C4, C4-C5 and C5-C6.  Multilevel facet arthropathy is also noted.  Prevertebral soft tissues are normal. Mild reversal normal cervical lordosis centered at the level of C4, related to underlying chronic degenerative changes, as above. Visualized portions of the upper thorax are unremarkable.  IMPRESSION: 1.  No evidence of significant acute traumatic injury to the cervical spine. 2.  Extensive multilevel degenerative disc disease and cervical spondylosis, as above.  Original Report Authenticated By: Florencia Reasons, M.D.    EKG: Initial EKG demonstrated atrial fibrillation; heart rate was 81; normal axis and normal ST segments and T-wave.  Assessment/Plan 1- Unwitnessed fall: Patient with dementia and  multiple falls in the past; EKG demonstrated no significant changes from her previous EKG done after discussing with the patient's daughter (healthcare power of attorney) and the daughter-in-law patient itself will not keep telemetry in place to evaluate for any further arrhythmias. A CT of the head has been done and was unremarkable ruling out any acute intracranial process or fractures; other component that might be contributing to the patient's fall orthostatic hypotension and acute UTI. Patient will be admitted for IV fluid resuscitation (she was mild dry on physical exam) and IV antibiotics. Urine culture has been taken prior to starting the patient on antibiotics.   2-UTI (lower urinary tract infection): IVF's, supportive care and rocephin while waiting urine cx.  3-DIABETES MELLITUS, TYPE II: will start SSI and CBG's QAC/QHS.  4-HYPERTENSION, BENIGN ESSENTIAL: Stable. Continue current medication.  5-GERD: Will start patient on Protonix daily.  6-Chest pain: Most likely secondary to gastroesophageal reflux disease. Will cardiac enzymes and EKG to rule out any acute coronary syndrome. Patient have a high risk factors for  she's not a candidate for any intervention; if she rules in for any heart problems plan will be conservative management.  7-Acute on chronic renal failure: Most likely secondary to mild dehydration and UTI. Will start patient on Rocephin and will provide IV fluid resuscitation.  8-Dementia: Continue supportive care.  9-Anemia of chronic disease: Hemoglobin in the 9 range which is patient's baseline. We'll follow CBC in the morning. No needs for transfusion at this point.   Code Status: DO NOT RESUSCITATE Family Communication: Daughter and daughter-in-law at bedside updated on all questions asked were regarding care of plan. Disposition Plan: Back to [facility] memory care unit when medically stable.  Time spent: More than 30 minutes  Keryl Gholson [facility] Pager [phone]  If 7PM-7AM, please contact night-coverage [email or web address] Password [email or web address] 10/13/2011, 2:52 PM

## 2011-10-13 NOTE — ED Notes (Signed)
Per EMS, had a fall 1 month ago and had injury to right foot and back-fell this am and patient reported to staff at assisted living-no LOC, patient did not report any precipitating factors-BP 150/72-CBG 112-patient denies any new complaints

## 2011-10-13 NOTE — ED Provider Notes (Signed)
History     CSN: [id number]  Arrival date & time 10/13/11  1003   First MD Initiated Contact with Patient 10/13/11 1007      Chief Complaint  Patient presents with  . Fall    (Consider location/radiation/quality/duration/timing/severity/associated sxs/prior treatment) HPI Comments: Patient is an 78 year old female brought to the ED after an unwitnessed fall at her assisted living facility. Per EMS, the patient reported falling in her room early this morning. When EMS arrived, she was sitting on the edge of her bed. The patient denies hitting her head or any LOC. She currently admits to back pain that was present prior to the fall today. She has a history of a fall with R foot injury 1 week ago and another fall 2 weeks ago. The patient has a medical history of Alzheimer's per EMS. She is a difficult historian.    Patient is a 78 y.o. female presenting with fall.  Fall    Past Medical History  Diagnosis Date  . Diabetes mellitus 06/2005    Type II  . Hypertension 1960  . Hyperlipidemia 02/2004  . Memory loss     history of dementia  . CVA (cerebral infarction)     x 3  . Stroke 2001  . Echocardiogram abnormal     a. 09/2003 EF 55-65%, Mild AR;  b. 05/2008 Echo - EF 66-70%, no RWMA, mid-cavity obliteration @ end systole, DD, mild AI/MR/TR, NL RV  . MRI of brain abnormal 09/15/2003    Old lunar infarct, tight L MVERT ART stenosis 40% RICA  . TIA (transient ischemic attack) 11/2007  . Pneumonia 11/2007  . Abnormal head CT 11/06/2007    Old L hemispheric stroke scattered white matter Dz no acute changes  . Abnormal head MRI 12/08/2007    No acute changes  . Abnormal MRA, head 11/08/2007    Stenosis dist R carotid <50% RICA 75-80% LICA <50%  . Chest pain 10/2008    R/O'd Epig. pain, shaking after Morphine  . Cellulitis 07/2009    RLL  . Macular degeneration   . CAD (coronary artery disease)     a. 04/2004 - PCI LAD - cypher DES to prox LAD, Vision BMS to mid-LAD, PTCA to D1;  b. 07/2004  - cath/PCI : LM-NL, LAD 59m ISR (treated with CBA), 70d, LCX minor irrges, RCA 20p, 30d;  c. 05/2008 Aden MV: EF 72%, no ischemia  . Dementia     Past Surgical History  Procedure Date  . Appendectomy     18 YOA  . Shoulder surgery 2004    Left  . Adenosine myoview 03/08/2004    Mild isch EF 72%  . Cardiac catheterization 04/09/2004    Severe single vessel disease, tx medically  . Cardiac catheterization 05/02/2004    PTCA X 3  . Instent restenosis 5/10 - 07/28/2004    [facility]  . Cardiac catheterization 07/19/2004    EF 65% Instent restenosis PTCA  . Abdominal ct 07/20/2004    Incr in retroperitoneal hemm  . Adenosine myoview 10/17/2004    Mild isch dist ant wall EF 69%  . Adenosine myoview 09/10/2005    Low risk  . Eeg     Pos left frontal temp focus  . Adenosine cardiolite 09/16/2006    No change, normal  . Carotid duplex, bilateral 11/09/2007    40-60% ICA Stenosis L>R  . Modified barium swallow 11/09/2007    No aspiration  . Technetium myoview 05/11/08  Normal  . Lower extremity US 10/26/08    No DVT  . Cataract     Family History  Problem Relation Age of Onset  . Aneurysm Mother   . Alcohol abuse Father   . Learning disabilities Sister   . Heart disease Sister     CAD  . Heart disease Brother   . Alcohol abuse Brother   . Alcohol abuse Brother   . Heart disease Brother     MI x 2    History  Substance Use Topics  . Smoking status: Never Smoker   . Smokeless tobacco: Never Used  . Alcohol Use: No    OB History    Grav Para Term Preterm Abortions TAB SAB Ect Mult Living                  Review of Systems  Unable to perform ROS   Allergies  Morphine sulfate  Home Medications   Current Outpatient Rx  Name Route Sig Dispense Refill  . ACETAMINOPHEN 325 MG PO TABS Oral Take 650 mg by mouth every 6 (six) hours as needed. For pain    . AMLODIPINE BESYLATE 10 MG PO TABS Oral Take 1 tablet (10 mg total) by mouth daily. 30 tablet 11  . ASPIRIN 81 MG PO CHEW Oral  Chew 81 mg by mouth daily.    . ATORVASTATIN CALCIUM 20 MG PO TABS Oral Take 20 mg by mouth every morning.    Marland Kitchen VITAMIN D 1000 UNITS PO TABS Oral Take 1 tablet (1,000 Units total) by mouth 2 (two) times daily. 60 tablet 6  . CLONIDINE HCL 0.2 MG PO TABS Oral Take 0.2 mg by mouth 2 (two) times daily.    Marland Kitchen CLOPIDOGREL BISULFATE 75 MG PO TABS Oral Take 1 tablet (75 mg total) by mouth daily. 30 tablet 3  . CRANBERRY 450 MG PO CAPS Oral Take 1 tablet by mouth daily.    Marland Kitchen DOCUSATE SODIUM 100 MG PO CAPS Oral Take 100 mg by mouth 2 (two) times daily as needed. For stool softner    . LEVETIRACETAM 500 MG PO TABS Oral Take 500 mg by mouth 2 (two) times daily.    Marland Kitchen LORAZEPAM 0.5 MG PO TABS Oral Take 0.25 mg by mouth every 8 (eight) hours as needed. For aggitation    . METOPROLOL TARTRATE 50 MG PO TABS Oral Take 1 tablet (50 mg total) by mouth 2 (two) times daily. 60 tablet 3  . MIRTAZAPINE 15 MG PO TABS Oral Take 7.5 mg by mouth at bedtime.    . ADULT MULTIVITAMIN W/MINERALS CH Oral Take 1 tablet by mouth daily.    Marland Kitchen NITROGLYCERIN 0.4 MG SL SUBL Sublingual Place 0.4 mg under the tongue every 5 (five) minutes as needed. For chest pain    . RANITIDINE HCL 150 MG PO CAPS Oral Take 150 mg by mouth every morning.     Marland Kitchen TRIMETHOPRIM 100 MG PO TABS Oral Take 100 mg by mouth daily.      There were no vitals taken for this visit.  Physical Exam  Constitutional: She appears well-developed and well-nourished. No distress.  HENT:  Head: Normocephalic and atraumatic.  Eyes:       Right pupil elliptical in shape and non reactive. 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 Left pupil is round and reactive.   Neck: Normal range of motion. Neck supple.  Cardiovascular: Normal rate and regular rhythm.  Exam reveals no gallop and no friction rub.  No murmur heard. Pulmonary/Chest: Effort normal and breath sounds normal. She has no wheezes. She exhibits no tenderness.  Abdominal: Soft.       RUQ tender to palpation.     Musculoskeletal:       Limited ROM due to pain of right ankle from previous injury.   Neurological:       Patient is alert with equal strength and sensation bilaterally.   Skin: Skin is warm and dry. No rash noted. She is not diaphoretic.  Psychiatric:       Answers some questions inappropriately most likely due to previous diagnosis of dementia.     ED Course  Procedures (including critical care time)   Labs Reviewed  CBC WITH DIFFERENTIAL  COMPREHENSIVE METABOLIC PANEL   No results found.   No diagnosis found.    MDM  11:27 AM Dr. Silverio Lay saw the patient and spoke with the family at bedside. I will recheck the patient for RUQ in 56min-1 hour. If she is still having RUQ pain, I will order a noncontrast CT of chest, abdomen, pelvis. The patient is a DNR and Dr. Silverio Lay recommends admitting the patient to telemetry for observation.   12:41 PM Dr. Silverio Lay saw the patient and reassessed for RUQ pain. Her abdomen is soft and nontender currently. Patient will be admitted to the hospital. Patient signed out to Dr. Silverio Lay.       Emilia Beck, PA-C 10/13/11 1243

## 2011-10-13 NOTE — ED Notes (Signed)
ZOX:[id number]<AV> Expected date:10/13/11<BR> Expected time:10:00 AM<BR> Means of arrival:Ambulance<BR> Comments:<BR> fall

## 2011-10-13 NOTE — ED Provider Notes (Addendum)
Medical screening examination/treatment/procedure(s) were conducted as a shared visit with non-physician practitioner(s) and myself.  I personally evaluated the patient during the encounter  This is a 78 yo F hx of DM, dementia here s/p fall. She had CP today then felt dizzy and fell backwards. Per EMS, she had head injury and patient is a poor historian. Nonfocal exam no signs of trauma. Initially, she had mild epigastric tenderness that resolved upon re-examination. Labs are significant for stable renal failure and UTI. Given ceftriaxone for UTI and patient will be admitted to tele for syncope. CT head/neck unremarkable.   I had extensive discussion with her daughters regarding her status. She is DNR but they want her comfortable. They are agreeable for observation in the hospital and IV abx for UTI. Discussed with hospitalist, Dr. Gwenlyn Perking, who feels that given patient is DNR she doesn't require tele. Will need trop x 3, IV abx and will observe in hospital.   Richardean Canal, MD 10/13/11 1336  EKG interpretation.    Date: 10/13/2011  Rate: 81  Rhythm: atrial fibrillation  QRS Axis: normal  Intervals: normal  ST/T Wave abnormalities: normal  Conduction Disutrbances:none  Narrative Interpretation:   Old EKG Reviewed: unchanged    Richardean Canal, MD 10/13/11 1401

## 2011-10-14 DIAGNOSIS — K219 Gastro-esophageal reflux disease without esophagitis: Secondary | ICD-10-CM

## 2011-10-14 LAB — BASIC METABOLIC PANEL
CO2: 19 mEq/L (ref 19–32)
Calcium: 8.9 mg/dL (ref 8.4–10.5)
Creatinine, Ser: 1.18 mg/dL — ABNORMAL HIGH (ref 0.50–1.10)
GFR calc non Af Amer: 41 mL/min — ABNORMAL LOW (ref >90)
Glucose, Bld: 134 mg/dL — ABNORMAL HIGH (ref 70–99)
Sodium: 140 mEq/L (ref 135–145)

## 2011-10-14 LAB — CARDIAC PANEL(CRET KIN+CKTOT+MB+TROPI)
CK, MB: 2.7 ng/mL (ref 0.3–4.0)
Relative Index: INVALID (ref 0.0–2.5)
Troponin I: <0.3 ng/mL (ref <0.30)
Troponin I: <0.3 ng/mL (ref <0.30)

## 2011-10-14 LAB — GLUCOSE, CAPILLARY: Glucose-Capillary: 101 mg/dL — ABNORMAL HIGH (ref 70–99)

## 2011-10-14 LAB — CBC
Hemoglobin: 8.3 g/dL — ABNORMAL LOW (ref 12.0–15.0)
MCH: 30.9 pg (ref 26.0–34.0)
MCHC: 33.3 g/dL (ref 30.0–36.0)
MCV: 92.6 fL (ref 78.0–100.0)

## 2011-10-14 MED ORDER — PANTOPRAZOLE SODIUM 40 MG PO TBEC: 40.0000 mg | DELAYED_RELEASE_TABLET | Freq: Every day | ORAL | Status: DC

## 2011-10-14 MED ORDER — OXYCODONE-ACETAMINOPHEN 5-325 MG PO TABS: 1.0000 | ORAL_TABLET | Freq: Three times a day (TID) | ORAL | Status: DC | PRN

## 2011-10-14 NOTE — Discharge Summary (Signed)
Physician Discharge Summary  Angela [name] ZOX:[id number] DOB: 11/04/1925 DOA: 10/13/2011  PCP: Crawford Givens, MD  Admit date: 10/13/2011 Discharge date: 10/14/2011  Recommendations for Outpatient Follow-up:  1. PCP (to repeat BMET to follow renal function and electrolytes; also to arrange palliative care meeting at the facility for further goals of care as requested by [facility])  Discharge Diagnoses:  Principal Problem:  *UTI (lower urinary tract infection) Active Problems:  DIABETES MELLITUS, TYPE II  HYPERTENSION, BENIGN ESSENTIAL  GERD  Chest pain  Acute on chronic renal failure  Dementia  Anemia of chronic disease   Discharge Condition: stable and improved. No CP, no N/V, abdominal pain, no fever and no dysuria.  Diet recommendation: heart healthy diet  Wt Readings from Last 3 Encounters:  10/13/11 72.576 kg (160 lb)  10/01/11 73.7 kg (162 lb 7.7 oz)  09/02/11 79.266 kg (174 lb 12 oz)    History of present illness:  78 year old female with a past medical history significant for hypertension, diabetes type 2 (controlled with diet), dementia, chronic renal failure (stage 2-3) and coronary artery disease; was brought to the major department from her facility secondary to unwitnessed fall. Patient in the Ed also endorses some mid chest/epigastric pain (which resolved on his own).   Hospital Course:  1- Unwitnessed fall: Patient with dementia and multiple falls in the past; EKG demonstrated no significant changes from her previous EKG done during previous admission. CE'z negative X3. after discussing with the patient's daughter (healthcare power of attorney) and the daughter-in-law patient itself will not keep telemetry in place to evaluate for any further arrhythmias. A CT of the head has been done and was unremarkable ruling out any acute intracranial process or fractures.  2-UTI (lower urinary tract infection): IVF's, supportive care were provided. Patient received ceftriaxone  X2 days. But since she was afebrile, no elevated WBC's and w/o dysuria. Antibiotics were discontinued. She will continue suppressive therapy tx with trimpex.   3-DIABETES MELLITUS, TYPE II: To be control with diet.   4-HYPERTENSION, BENIGN ESSENTIAL: Stable. Continue current medication.   5-GERD: Will start patient on Protonix daily.   6-Chest pain:Chest/epigastric discomfort 2/2 to GERD. Will start patient on  PPI.  7-Acute on chronic renal failure: Most likely secondary to mild dehydration and UTI. Resolved with IVf's. PCP to repeat BMET during follow up appointment. Also patient advised to keep herself hydrated. Cr back to baseline.  8-Dementia: Continue supportive care.   9-Anemia of chronic disease: Hemoglobin in the 9 range which is patient's baseline.   Procedures:  none  Consultations:  none  Discharge Exam: Filed Vitals:   10/14/11 0603  BP: 145/83  Pulse: 70  Temp: 98.6 F (37 C)  Resp: 16   Filed Vitals:   10/13/11 1642 10/13/11 1948 10/13/11 2121 10/14/11 0603  BP: 132/66 170/69 159/71 145/83  Pulse: 81 117 121 70  Temp: 98.6 F (37 C) 99.1 F (37.3 C) 99.8 F (37.7 C) 98.6 F (37 C)  TempSrc: Oral  Oral Oral  Resp: 18  20 16   Height: 5\' 2"  (1.575 m)     Weight: 72.576 kg (160 lb)     SpO2: 98%  97% 97%    General: NAD, afebrile Cardiovascular: S1 and S2; positive murmur Respiratory: CTA Abdomen: soft; NT/ND; +BS Neurologic:AAOX1; cn intact; no acute motor deficit appreciated. Skin: Multiple bruises especially involving her legs arms and on her back from previous falls. There is also an open wound in healing process without signs of infection of  her right foot dorsal aspect. Extremities:Chronic 1-2 +edema; no cyanosis  Discharge Instructions  Discharge Orders    Future Orders Please Complete By Expires   Diet - low sodium heart healthy      Discharge instructions      Comments:   -Keep patient well hydrated -take medications as  prescribed -Follow MOST form to granted patient wishes and care needs. -Arranged follow up with PCP in 10 days.     Medication List  As of 10/14/2011  1:55 PM   TAKE these medications         amLODipine 10 MG tablet   Commonly known as: NORVASC   Take 1 tablet (10 mg total) by mouth daily.      aspirin 81 MG chewable tablet   Chew 81 mg by mouth daily.      atorvastatin 20 MG tablet   Commonly known as: LIPITOR   Take 20 mg by mouth every morning.      cholecalciferol 1000 UNITS tablet   Commonly known as: VITAMIN D   Take 1 tablet (1,000 Units total) by mouth 2 (two) times daily.      cloNIDine 0.2 MG tablet   Commonly known as: CATAPRES   Take 0.2 mg by mouth 2 (two) times daily.      clopidogrel 75 MG tablet   Commonly known as: PLAVIX   Take 1 tablet (75 mg total) by mouth daily.      Cranberry 405 MG Caps   Take 1 capsule by mouth daily.      docusate sodium 100 MG capsule   Commonly known as: COLACE   Take 100 mg by mouth 2 (two) times daily as needed. For stool softner      levETIRAcetam 500 MG tablet   Commonly known as: KEPPRA   Take 500 mg by mouth 2 (two) times daily.      metoprolol tartrate 25 MG tablet   Commonly known as: LOPRESSOR   Take 25 mg by mouth 2 (two) times daily.      mirtazapine 15 MG tablet   Commonly known as: REMERON   Take 7.5 mg by mouth at bedtime.      multivitamin with minerals Tabs   Take 1 tablet by mouth daily.      nitroGLYCERIN 0.4 MG SL tablet   Commonly known as: NITROSTAT   Place 0.4 mg under the tongue every 5 (five) minutes as needed. For chest pain      oxyCODONE-acetaminophen 5-325 MG per tablet   Commonly known as: PERCOCET/ROXICET   Take 1 tablet by mouth every 8 (eight) hours as needed. For pain.      pantoprazole 40 MG tablet   Commonly known as: PROTONIX   Take 1 tablet (40 mg total) by mouth daily at 12 noon.      ranitidine 150 MG capsule   Commonly known as: ZANTAC   Take 150 mg by mouth every  morning.      trimethoprim 100 MG tablet   Commonly known as: TRIMPEX   Take 100 mg by mouth daily.           Follow-up Information    Follow up with Crawford Givens, MD in 10 days.   Contact information:   [address] [place] [address] [phone]           The results of significant diagnostics from this hospitalization (including imaging, microbiology, ancillary and laboratory) are listed below for reference.  Significant Diagnostic Studies: Ct Head Wo Contrast  10/13/2011  *RADIOLOGY REPORT*  Clinical Data:  Dementia.  History of fall.  Potential trauma.  CT HEAD WITHOUT CONTRAST CT CERVICAL SPINE WITHOUT CONTRAST  Technique:  Multidetector CT imaging of the head and cervical spine was performed following the standard protocol without intravenous contrast.  Multiplanar CT image reconstructions of the cervical spine were also generated.  Comparison:  Head CT 09/30/2011.  CT HEAD  Findings: No acute displaced skull fractures are identified.  The cerebral and cerebellar atrophy is again noted.  Large lacunar infarct in the left basal ganglia, with multiple other smaller lacunar infarcts in the basal ganglia bilaterally.  Extensive patchy and confluent areas of decreased attenuation throughout the deep and periventricular white matter of the cerebral hemispheres bilaterally, compatible chronic microvascular ischemic disease.  No definite acute intracranial abnormality.  Specifically, no definite signs of acute post-traumatic intracranial hemorrhage, no area of acute/subacute cerebral ischemia, no focal mass, mass effect, hydrocephalus or abnormal intra or extra-axial fluid collections. Visualized paranasal sinuses and mastoids are well pneumatized.  IMPRESSION: 1.  No acute displaced skull fracture or findings to suggest acute intracranial abnormalities. 2.  The appearance of the brain is essentially unchanged compared to the recent prior studies 09/30/2011, as  detailed above.  CT CERVICAL SPINE  Findings: No acute displaced cervical spine fractures are identified.  There is severe multilevel degenerative disc disease, most pronounced at C3-C4, C4-C5 and C5-C6.  Multilevel facet arthropathy is also noted.  Prevertebral soft tissues are normal. Mild reversal normal cervical lordosis centered at the level of C4, related to underlying chronic degenerative changes, as above. Visualized portions of the upper thorax are unremarkable.  IMPRESSION: 1.  No evidence of significant acute traumatic injury to the cervical spine. 2.  Extensive multilevel degenerative disc disease and cervical spondylosis, as above.  Original Report Authenticated By: Florencia Reasons, M.D.   Ct Head Wo Contrast  09/30/2011  *RADIOLOGY REPORT*  Clinical Data: Fall out of wheelchair.  CT HEAD WITHOUT CONTRAST  Technique:  Contiguous axial images were obtained from the base of the skull through the vertex without contrast.  Comparison: 11/06/2007  Findings: The brain shows advanced atrophy and small vessel ischemic changes.  Stable appearance of an old white matter infarct in the left frontal lobe with associated dilatation of the adjacent lateral ventricle.  The brain demonstrates no evidence of hemorrhage, acute cortical infarction, edema, mass effect, extra-axial fluid collection, hydrocephalus or mass lesion.  The skull is unremarkable.  IMPRESSION: No acute findings.  Atrophy, small vessel disease and old left frontal infarct.  Original Report Authenticated By: Reola Calkins, M.D.   Ct Cervical Spine Wo Contrast  10/13/2011  *RADIOLOGY REPORT*  Clinical Data:  Dementia.  History of fall.  Potential trauma.  CT HEAD WITHOUT CONTRAST CT CERVICAL SPINE WITHOUT CONTRAST  Technique:  Multidetector CT imaging of the head and cervical spine was performed following the standard protocol without intravenous contrast.  Multiplanar CT image reconstructions of the cervical spine were also generated.   Comparison:  Head CT 09/30/2011.  CT HEAD  Findings: No acute displaced skull fractures are identified.  The cerebral and cerebellar atrophy is again noted.  Large lacunar infarct in the left basal ganglia, with multiple other smaller lacunar infarcts in the basal ganglia bilaterally.  Extensive patchy and confluent areas of decreased attenuation throughout the deep and periventricular white matter of the cerebral hemispheres bilaterally, compatible chronic microvascular ischemic disease.  No definite acute intracranial  abnormality.  Specifically, no definite signs of acute post-traumatic intracranial hemorrhage, no area of acute/subacute cerebral ischemia, no focal mass, mass effect, hydrocephalus or abnormal intra or extra-axial fluid collections. Visualized paranasal sinuses and mastoids are well pneumatized.  IMPRESSION: 1.  No acute displaced skull fracture or findings to suggest acute intracranial abnormalities. 2.  The appearance of the brain is essentially unchanged compared to the recent prior studies 09/30/2011, as detailed above.  CT CERVICAL SPINE  Findings: No acute displaced cervical spine fractures are identified.  There is severe multilevel degenerative disc disease, most pronounced at C3-C4, C4-C5 and C5-C6.  Multilevel facet arthropathy is also noted.  Prevertebral soft tissues are normal. Mild reversal normal cervical lordosis centered at the level of C4, related to underlying chronic degenerative changes, as above. Visualized portions of the upper thorax are unremarkable.  IMPRESSION: 1.  No evidence of significant acute traumatic injury to the cervical spine. 2.  Extensive multilevel degenerative disc disease and cervical spondylosis, as above.  Original Report Authenticated By: Florencia Reasons, M.D.   Ct Foot Right Wo Contrast  09/27/2011  *RADIOLOGY REPORT*  Clinical Data: Foot pain and swelling since an injury 6 days ago. Question fracture.  CT OF THE RIGHT FOOT WITHOUT CONTRAST   Technique:  Multidetector CT imaging was performed according to the standard protocol. Multiplanar CT image reconstructions were also generated.  Comparison: Plain films 09/22/2011.  Findings: Extensive subcutaneous edema is seen about the foot. There is a focal area of high attenuation in the dorsal subcutaneous tissues consistent with contusion overlying the distal first metatarsal.  The contusion measures 3.0 cm in long by 1.1 cm cranial-caudal by 2.9 cm transverse.  There is no underlying fracture.  Mild first MTP degenerative change is noted.  Visualized ligaments and tendons appear intact.  IMPRESSION: Soft tissue edema over the dorsum of the foot with a focal hematoma over the distal first metatarsal.  Negative for fracture. Specifically, there is no fracture of the distal tibia.  Original Report Authenticated By: Bernadene Bell. Maricela Curet, M.D.   US Renal Port  10/01/2011  *RADIOLOGY REPORT*  Clinical Data: Acute renal failure.  RENAL/URINARY TRACT ULTRASOUND COMPLETE  Comparison:  None  Findings:  Right Kidney:  7.8 cm in length.  Mild renal cortical thinning is noted.  No hydronephrosis.  A small cyst is demonstrated.  Left Kidney:  11.4 cm in length.  Normal renal echogenicity.  Mild renal cortical thinning.  No hydronephrosis.  A 4.9 x 5.2 x 4.4 cm mid pole cyst is noted.  Bladder:  Normal.  IMPRESSION:  1.  Mild renal cortical thinning but no hydronephrosis. 2.  Bilateral cysts.  Original Report Authenticated By: P. Loralie Champagne, M.D.   Dg Ankle Right Port  09/22/2011  *RADIOLOGY REPORT*  Clinical Data: Fall, lateral ankle pain.  PORTABLE RIGHT ANKLE - 2 VIEW  Comparison: Contemporaneous foot  Findings: No fractures identified however limited positioning due to patient discomfort.  Diffuse soft tissue swelling about the ankle and dorsal soft tissue swelling overlies the foot. Osteopenia.  IMPRESSION: No fracture demonstrated, may have been artifactual on the contemporaneous foot radiographs.If  clinical concern for a fracture persists, recommend a repeat radiograph in 5-10 days to evaluate for interval change or callus formation.  Original Report Authenticated By: Waneta Martins, M.D.   Dg Foot Complete Right  09/22/2011  *RADIOLOGY REPORT*  Clinical Data: Fall, abrasion over the first digit.  RIGHT FOOT COMPLETE - 3+ VIEW  Comparison: None.  Findings: Diffuse osteopenia.  There is evidence of a distal tibial fracture. Intact Lisfranc joint.  No additional fracture or dislocation.  Soft tissue fullness of the first metatarsal phalangeal joint with mild hallux valgus.  IMPRESSION: Distal tibial fracture is age indeterminate.  Correlate with point tenderness.  Mild first digit hallux valgus deformity.  Original Report Authenticated By: Waneta Martins, M.D.    Labs: Basic Metabolic Panel:  Lab 10/14/11 1610 10/13/11 1043  NA 140 139  K 4.0 3.9  CL 111 108  CO2 19 19  GLUCOSE 134* 111*  BUN 17 25*  CREATININE 1.18* 1.59*  CALCIUM 8.9 9.8  MG -- --  PHOS -- --   Liver Function Tests:  Lab 10/13/11 1043  AST 20  ALT 12  ALKPHOS 79  BILITOT 0.3  PROT 6.7  ALBUMIN 3.5   CBC:  Lab 10/14/11 0058 10/13/11 1043  WBC 5.7 5.4  NEUTROABS -- 3.3  HGB 8.3* 9.9*  HCT 24.9* 29.3*  MCV 92.6 92.1  PLT 163 189   Cardiac Enzymes:  Lab 10/14/11 0901 10/14/11 0058 10/13/11 1555  CKTOTAL 51 53 46  CKMB 3.0 2.7 2.3  CKMBINDEX -- -- --  TROPONINI <0.30 <0.30 <0.30   CBG:  Lab 10/14/11 1150 10/14/11 0752 10/13/11 2216 10/13/11 1705  GLUCAP 123* 101* 159* 109*    Time coordinating discharge: >30 minutes  Signed:  Yaphet Smethurst  [facility] 10/14/2011, 1:55 PM

## 2011-10-14 NOTE — Progress Notes (Signed)
0930 Patient agitated, refuses medication, states she wants to leave, bed alarm activated.  Patient refuses mittens placement for IV protection.  IV removed by patient catheter intact, gauze applied to site.  Patient daughter arrived, update given at bedside.

## 2011-10-22 DIAGNOSIS — E119 Type 2 diabetes mellitus without complications: Secondary | ICD-10-CM

## 2011-10-22 DIAGNOSIS — F0390 Unspecified dementia without behavioral disturbance: Secondary | ICD-10-CM

## 2011-10-22 DIAGNOSIS — S91309A Unspecified open wound, unspecified foot, initial encounter: Secondary | ICD-10-CM

## 2011-10-22 DIAGNOSIS — I2510 Atherosclerotic heart disease of native coronary artery without angina pectoris: Secondary | ICD-10-CM

## 2011-10-22 DIAGNOSIS — I10 Essential (primary) hypertension: Secondary | ICD-10-CM

## 2012-01-21 ENCOUNTER — Encounter (HOSPITAL_COMMUNITY): Payer: Self-pay | Admitting: Emergency Medicine

## 2012-01-21 ENCOUNTER — Emergency Department (HOSPITAL_COMMUNITY): Payer: Medicare Other

## 2012-01-21 ENCOUNTER — Emergency Department (HOSPITAL_COMMUNITY)
Admission: EM | Admit: 2012-01-21 | Discharge: 2012-01-21 | Disposition: A | Discharge status: Home / Self Care | Payer: Medicare Other | Attending: Emergency Medicine | Admitting: Emergency Medicine

## 2012-01-21 DIAGNOSIS — I2510 Atherosclerotic heart disease of native coronary artery without angina pectoris: Secondary | ICD-10-CM | POA: Insufficient documentation

## 2012-01-21 DIAGNOSIS — F0390 Unspecified dementia without behavioral disturbance: Secondary | ICD-10-CM | POA: Insufficient documentation

## 2012-01-21 DIAGNOSIS — Z79899 Other long term (current) drug therapy: Secondary | ICD-10-CM | POA: Insufficient documentation

## 2012-01-21 DIAGNOSIS — Y929 Unspecified place or not applicable: Secondary | ICD-10-CM | POA: Insufficient documentation

## 2012-01-21 DIAGNOSIS — Z8701 Personal history of pneumonia (recurrent): Secondary | ICD-10-CM | POA: Insufficient documentation

## 2012-01-21 DIAGNOSIS — S2239XA Fracture of one rib, unspecified side, initial encounter for closed fracture: Secondary | ICD-10-CM

## 2012-01-21 DIAGNOSIS — E785 Hyperlipidemia, unspecified: Secondary | ICD-10-CM | POA: Insufficient documentation

## 2012-01-21 DIAGNOSIS — W19XXXA Unspecified fall, initial encounter: Secondary | ICD-10-CM | POA: Insufficient documentation

## 2012-01-21 DIAGNOSIS — Z8673 Personal history of transient ischemic attack (TIA), and cerebral infarction without residual deficits: Secondary | ICD-10-CM | POA: Insufficient documentation

## 2012-01-21 DIAGNOSIS — Z7982 Long term (current) use of aspirin: Secondary | ICD-10-CM | POA: Insufficient documentation

## 2012-01-21 DIAGNOSIS — Y939 Activity, unspecified: Secondary | ICD-10-CM | POA: Insufficient documentation

## 2012-01-21 DIAGNOSIS — E119 Type 2 diabetes mellitus without complications: Secondary | ICD-10-CM | POA: Insufficient documentation

## 2012-01-21 DIAGNOSIS — N390 Urinary tract infection, site not specified: Secondary | ICD-10-CM | POA: Insufficient documentation

## 2012-01-21 DIAGNOSIS — I10 Essential (primary) hypertension: Secondary | ICD-10-CM | POA: Insufficient documentation

## 2012-01-21 DIAGNOSIS — IMO0002 Reserved for concepts with insufficient information to code with codable children: Secondary | ICD-10-CM | POA: Insufficient documentation

## 2012-01-21 DIAGNOSIS — S2249XA Multiple fractures of ribs, unspecified side, initial encounter for closed fracture: Secondary | ICD-10-CM | POA: Insufficient documentation

## 2012-01-21 LAB — CBC WITH DIFFERENTIAL/PLATELET
Basophils Relative: 0 % (ref 0–1)
Eosinophils Absolute: 0.3 10*3/uL (ref 0.0–0.7)
Eosinophils Relative: 4 % (ref 0–5)
HCT: 35.4 % — ABNORMAL LOW (ref 36.0–46.0)
Hemoglobin: 11.6 g/dL — ABNORMAL LOW (ref 12.0–15.0)
Lymphs Abs: 0.8 10*3/uL (ref 0.7–4.0)
Monocytes Relative: 11 % (ref 3–12)
Neutrophils Relative %: 72 % (ref 43–77)
RDW: 12 % (ref 11.5–15.5)
WBC: 6.7 10*3/uL (ref 4.0–10.5)

## 2012-01-21 LAB — URINALYSIS, ROUTINE W REFLEX MICROSCOPIC
Hgb urine dipstick: NEGATIVE
Ketones, ur: NEGATIVE mg/dL
Nitrite: NEGATIVE
Protein, ur: >300 mg/dL — AB
Urobilinogen, UA: 1 mg/dL (ref 0.0–1.0)

## 2012-01-21 LAB — BASIC METABOLIC PANEL
BUN: 14 mg/dL (ref 6–23)
Calcium: 9.7 mg/dL (ref 8.4–10.5)
GFR calc Af Amer: 58 mL/min — ABNORMAL LOW (ref >90)
GFR calc non Af Amer: 50 mL/min — ABNORMAL LOW (ref >90)
Glucose, Bld: 197 mg/dL — ABNORMAL HIGH (ref 70–99)

## 2012-01-21 LAB — URINE MICROSCOPIC-ADD ON

## 2012-01-21 MED ORDER — OXYCODONE-ACETAMINOPHEN 5-325 MG PO TABS
1.0000 | ORAL_TABLET | Freq: Once | ORAL | Status: AC
  Administered 2012-01-21: 1 via ORAL
  Filled 2012-01-21 (×2): qty 1

## 2012-01-21 MED ORDER — OXYCODONE-ACETAMINOPHEN 5-325 MG PO TABS: 1.0000 | ORAL_TABLET | Freq: Once | ORAL | Status: DC

## 2012-01-21 MED ORDER — OXYCODONE-ACETAMINOPHEN 5-325 MG PO TABS: 1.0000 | ORAL_TABLET | ORAL | Status: DC | PRN

## 2012-01-21 MED ORDER — ONDANSETRON 4 MG PO TBDP
8.0000 mg | ORAL_TABLET | Freq: Once | ORAL | Status: AC
  Administered 2012-01-21: 8 mg via ORAL
  Filled 2012-01-21: qty 2

## 2012-01-21 MED ORDER — ONDANSETRON 4 MG PO TBDP: 8.0000 mg | ORAL_TABLET | Freq: Once | ORAL | Status: DC

## 2012-01-21 MED ORDER — CEPHALEXIN 500 MG PO CAPS: 500.0000 mg | ORAL_CAPSULE | Freq: Three times a day (TID) | ORAL | Status: DC

## 2012-01-21 MED ORDER — OXYCODONE-ACETAMINOPHEN 5-325 MG PO TABS
1.0000 | ORAL_TABLET | Freq: Once | ORAL | Status: AC
  Administered 2012-01-21: 1 via ORAL
  Filled 2012-01-21: qty 1

## 2012-01-21 NOTE — ED Notes (Signed)
Pt here from nursing home, EMS reports pt c/o back pain and right arm and should pain. Bruise noted on mid back and left arm.

## 2012-01-21 NOTE — ED Notes (Signed)
Waiting for transportation back to facility.

## 2012-01-21 NOTE — ED Provider Notes (Signed)
History     CSN: [id number]  Arrival date & time 01/21/12  1431   First MD Initiated Contact with Patient 01/21/12 1524      Chief Complaint  Patient presents with  . Back Pain  . Arm Pain    (Consider location/radiation/quality/duration/timing/severity/associated sxs/prior treatment) Patient is a 78 y.o. female presenting with back pain and arm pain. The history is provided by a relative and the nursing home. The history is limited by the condition of the patient (dementia).  Back Pain   Arm Pain  She started complaining of pain in the right chest and right shoulder this morning. There is no known fall, but she has a history of multiple falls. She does have a history of severe dementia and so her lack of memory of falls not necessarily helpful. Nursing and did notice what appeared to be a new bruise in her right flank area. Her daughter states that she has a frozen shoulder on the right and suspects that her shoulder pain is actually in that pre-existing frozen shoulder.   Past Medical History  Diagnosis Date  . Diabetes mellitus 06/2005    Type II  . Hypertension 1960  . Hyperlipidemia 02/2004  . Memory loss     history of dementia  . CVA (cerebral infarction)     x 3  . Stroke 2001  . Echocardiogram abnormal     a. 09/2003 EF 55-65%, Mild AR;  b. 05/2008 Echo - EF 66-70%, no RWMA, mid-cavity obliteration @ end systole, DD, mild AI/MR/TR, NL RV  . MRI of brain abnormal 09/15/2003    Old lunar infarct, tight L MVERT ART stenosis 40% RICA  . TIA (transient ischemic attack) 11/2007  . Pneumonia 11/2007  . Abnormal head CT 11/06/2007    Old L hemispheric stroke scattered white matter Dz no acute changes  . Abnormal head MRI 12/08/2007    No acute changes  . Abnormal MRA, head 11/08/2007    Stenosis dist R carotid <50% RICA 75-80% LICA <50%  . Chest pain 10/2008    R/O'd Epig. pain, shaking after Morphine  . Cellulitis 07/2009    RLL  . Macular degeneration   . CAD (coronary  artery disease)     a. 04/2004 - PCI LAD - cypher DES to prox LAD, Vision BMS to mid-LAD, PTCA to D1;  b. 07/2004 - cath/PCI : LM-NL, LAD 35m ISR (treated with CBA), 70d, LCX minor irrges, RCA 20p, 30d;  c. 05/2008 Aden MV: EF 72%, no ischemia  . Dementia     Past Surgical History  Procedure Date  . Appendectomy     18 YOA  . Shoulder surgery 2004    Left  . Adenosine myoview 03/08/2004    Mild isch EF 72%  . Cardiac catheterization 04/09/2004    Severe single vessel disease, tx medically  . Cardiac catheterization 05/02/2004    PTCA X 3  . Instent restenosis 5/10 - 07/28/2004    [facility]  . Cardiac catheterization 07/19/2004    EF 65% Instent restenosis PTCA  . Abdominal ct 07/20/2004    Incr in retroperitoneal hemm  . Adenosine myoview 10/17/2004    Mild isch dist ant wall EF 69%  . Adenosine myoview 09/10/2005    Low risk  . Eeg     Pos left frontal temp focus  . Adenosine cardiolite 09/16/2006    No change, normal  . Carotid duplex, bilateral 11/09/2007    40-60% ICA Stenosis L>R  . Modified  barium swallow 11/09/2007    No aspiration  . Technetium myoview 05/11/08    Normal  . Lower extremity US 10/26/08    No DVT  . Cataract     Family History  Problem Relation Age of Onset  . Aneurysm Mother   . Alcohol abuse Father   . Learning disabilities Sister   . Heart disease Sister     CAD  . Heart disease Brother   . Alcohol abuse Brother   . Alcohol abuse Brother   . Heart disease Brother     MI x 2    History  Substance Use Topics  . Smoking status: Never Smoker   . Smokeless tobacco: Never Used  . Alcohol Use: No    OB History    Grav Para Term Preterm Abortions TAB SAB Ect Mult Living                  Review of Systems  Unable to perform ROS: Dementia  Musculoskeletal: Positive for back pain.    Allergies  Morphine sulfate  Home Medications   Current Outpatient Rx  Name  Route  Sig  Dispense  Refill  . ACETAMINOPHEN 325 MG PO TABS   Oral   Take 650 mg by  mouth every 6 (six) hours as needed. For pain         . AMLODIPINE BESYLATE 10 MG PO TABS   Oral   Take 1 tablet (10 mg total) by mouth daily.   30 tablet   11   . ASPIRIN 81 MG PO CHEW   Oral   Chew 81 mg by mouth daily.         . ATORVASTATIN CALCIUM 20 MG PO TABS   Oral   Take 20 mg by mouth every morning.         Marland Kitchen VITAMIN D 1000 UNITS PO TABS   Oral   Take 1 tablet (1,000 Units total) by mouth 2 (two) times daily.   60 tablet   6   . CLONIDINE HCL 0.2 MG PO TABS   Oral   Take 0.2 mg by mouth 2 (two) times daily.         Marland Kitchen CLOPIDOGREL BISULFATE 75 MG PO TABS   Oral   Take 1 tablet (75 mg total) by mouth daily.   30 tablet   3   . CRANBERRY 405 MG PO CAPS   Oral   Take 1 capsule by mouth daily.         Marland Kitchen DOCUSATE SODIUM 100 MG PO CAPS   Oral   Take 100 mg by mouth 2 (two) times daily as needed. For stool softner         . LEVETIRACETAM 500 MG PO TABS   Oral   Take 500 mg by mouth 2 (two) times daily.         Marland Kitchen LORAZEPAM 0.5 MG PO TABS   Oral   Take 0.25 mg by mouth See admin instructions. Take 0.5 tablet at 2pm and before bedtime         . METOPROLOL TARTRATE 25 MG PO TABS   Oral   Take 25 mg by mouth 2 (two) times daily.         Marland Kitchen MIRTAZAPINE 15 MG PO TABS   Oral   Take 7.5 mg by mouth at bedtime.         . ADULT MULTIVITAMIN W/MINERALS CH   Oral   Take 1 tablet by  mouth daily.         Marland Kitchen NITROGLYCERIN 0.4 MG SL SUBL   Sublingual   Place 0.4 mg under the tongue every 5 (five) minutes as needed. For chest pain         . OVER THE COUNTER MEDICATION   Perineal   Place 1 application into the perineum once a week. On Saturday  Mix 1 TBSP of Vinegar with 1 pint of warm water         . OXYCODONE-ACETAMINOPHEN 5-325 MG PO TABS   Oral   Take 1 tablet by mouth every 8 (eight) hours as needed. For pain.   30 tablet   0   . RANITIDINE HCL 150 MG PO CAPS   Oral   Take 150 mg by mouth every morning.          Marland Kitchen TRIMETHOPRIM  100 MG PO TABS   Oral   Take 100 mg by mouth daily.           BP 180/73  Pulse 65  Temp 97.6 F (36.4 C) (Oral)  Resp 18  SpO2 98%  Physical Exam  Nursing note and vitals reviewed. 78 year old female, resting comfortably and in no acute distress. Vital signs are significant for hypertension with blood pressure 180/73. Oxygen saturation is 98%, which is normal. Head is normocephalic and atraumatic. PERRLA, EOMI. Oropharynx is clear. Neck is nontender and supple without adenopathy or JVD. Back is nontender. Ecchymosis is seen over the right posterior lower rib cage. Lungs are clear without rales, wheezes, or rhonchi. Chest is tender diffusely in the right lateral, posterior lateral, and anterolateral rib cage. Heart has regular rate and rhythm without murmur. Abdomen is soft, flat, nontender without masses or hepatosplenomegaly and peristalsis is normoactive. Extremities have no cyanosis or edema, full range of motion is present. Skin is warm and dry without rash. Neurologic: She is awake, alert, and oriented to person but not place or time, cranial nerves are intact, there are no motor or sensory deficits.   ED Course  Procedures (including critical care time)  Results for orders placed during the hospital encounter of 01/21/12  CBC WITH DIFFERENTIAL      Component Value Range   WBC 6.7  4.0 - 10.5 K/uL   RBC 3.98  3.87 - 5.11 MIL/uL   Hemoglobin 11.6 (*) 12.0 - 15.0 g/dL   HCT 40.9 (*) 81.1 - 91.4 %   MCV 88.9  78.0 - 100.0 fL   MCH 29.1  26.0 - 34.0 pg   MCHC 32.8  30.0 - 36.0 g/dL   RDW 78.2  95.6 - 21.3 %   Platelets 187  150 - 400 K/uL   Neutrophils Relative 72  43 - 77 %   Neutro Abs 4.9  1.7 - 7.7 K/uL   Lymphocytes Relative 13  12 - 46 %   Lymphs Abs 0.8  0.7 - 4.0 K/uL   Monocytes Relative 11  3 - 12 %   Monocytes Absolute 0.7  0.1 - 1.0 K/uL   Eosinophils Relative 4  0 - 5 %   Eosinophils Absolute 0.3  0.0 - 0.7 K/uL   Basophils Relative 0  0 - 1 %    Basophils Absolute 0.0  0.0 - 0.1 K/uL  BASIC METABOLIC PANEL      Component Value Range   Sodium 140  135 - 145 mEq/L   Potassium 3.6  3.5 - 5.1 mEq/L   Chloride 102  96 -  112 mEq/L   CO2 28  19 - 32 mEq/L   Glucose, Bld 197 (*) 70 - 99 mg/dL   BUN 14  6 - 23 mg/dL   Creatinine, Ser 1.61  0.50 - 1.10 mg/dL   Calcium 9.7  8.4 - 09.6 mg/dL   GFR calc non Af Amer 50 (*) >90 mL/min   GFR calc Af Amer 58 (*) >90 mL/min  URINALYSIS, ROUTINE W REFLEX MICROSCOPIC      Component Value Range   Color, Urine YELLOW  YELLOW   APPearance HAZY (*) CLEAR   Specific Gravity, Urine 1.027  1.005 - 1.030   pH 5.5  5.0 - 8.0   Glucose, UA NEGATIVE  NEGATIVE mg/dL   Hgb urine dipstick NEGATIVE  NEGATIVE   Bilirubin Urine SMALL (*) NEGATIVE   Ketones, ur NEGATIVE  NEGATIVE mg/dL   Protein, ur >045 (*) NEGATIVE mg/dL   Urobilinogen, UA 1.0  0.0 - 1.0 mg/dL   Nitrite NEGATIVE  NEGATIVE   Leukocytes, UA MODERATE (*) NEGATIVE  URINE MICROSCOPIC-ADD ON      Component Value Range   Squamous Epithelial / LPF MANY (*) RARE   WBC, UA 11-20  <3 WBC/hpf   RBC / HPF 0-2  <3 RBC/hpf   Bacteria, UA FEW (*) RARE   Casts GRANULAR CAST (*) NEGATIVE   Dg Ribs Unilateral W/chest Right  01/21/2012  *RADIOLOGY REPORT*  Clinical Data: Recent fall with back and arm pain, dementia  RIGHT RIBS AND CHEST - 3+ VIEW  Comparison: Portable chest x-ray of 11/08/2007  Findings: There is mild linear atelectasis at the right lung base. No pneumothorax is seen.  Cardiomegaly is stable.  The bones are osteopenic.  Right rib detail films do show nondisplaced acute fractures of the right posterolateral eighth, ninth, and tenth ribs.  IMPRESSION:  1.  Acute fractures of the right eighth, ninth, and tenth ribs.  No pneumothorax. 2.  Mild right basilar linear atelectasis. 3.  Cardiomegaly.   Original Report Authenticated By: Dwyane Dee, M.D.     Images viewed by me. Portions of the humerus viewed on x-ray showed no evidence of  fracture.  1. Fall   2. Rib fractures   3. Urinary tract infection       MDM  Pain in the right rib cage and shoulder which may be from an occult fall. She is being sent for x-rays. She also has a history of renal failure and metabolic panel will be checked. Urinalysis will be checked to look for possible renal contusion.  Urinalysis shows evidence of urinary tract infection and specimen sent for culture. She'll be sent home with a prescription for Keflex. Rib series shows 3 rib fractures and she will be sent home with prescription for Percocet.      Dione Booze, MD 01/21/12 2007

## 2012-01-21 NOTE — ED Notes (Signed)
Pt reports back pain and right arm and right shoulder pain. Limited movement at shoulder noted

## 2012-01-21 NOTE — ED Notes (Signed)
Daughter at bedside currently decline right shoulder x-ray until speaking with EDP.

## 2012-01-21 NOTE — ED Notes (Signed)
X-ray at bedside spoke with daughter who states now requested anti nausea and pain medication for patient who also spoke with Nurse.

## 2012-01-23 LAB — URINE CULTURE: Colony Count: 15000

## 2012-03-01 ENCOUNTER — Emergency Department (HOSPITAL_COMMUNITY)
Admission: EM | Admit: 2012-03-01 | Discharge: 2012-03-01 | Disposition: A | Discharge status: Home / Self Care | Attending: Emergency Medicine | Admitting: Emergency Medicine

## 2012-03-01 ENCOUNTER — Encounter (HOSPITAL_COMMUNITY): Payer: Self-pay | Admitting: Emergency Medicine

## 2012-03-01 ENCOUNTER — Emergency Department (HOSPITAL_COMMUNITY)

## 2012-03-01 DIAGNOSIS — Z8673 Personal history of transient ischemic attack (TIA), and cerebral infarction without residual deficits: Secondary | ICD-10-CM | POA: Insufficient documentation

## 2012-03-01 DIAGNOSIS — Z8669 Personal history of other diseases of the nervous system and sense organs: Secondary | ICD-10-CM | POA: Insufficient documentation

## 2012-03-01 DIAGNOSIS — F0390 Unspecified dementia without behavioral disturbance: Secondary | ICD-10-CM | POA: Insufficient documentation

## 2012-03-01 DIAGNOSIS — Z9181 History of falling: Secondary | ICD-10-CM | POA: Insufficient documentation

## 2012-03-01 DIAGNOSIS — W19XXXA Unspecified fall, initial encounter: Secondary | ICD-10-CM | POA: Insufficient documentation

## 2012-03-01 DIAGNOSIS — Y9289 Other specified places as the place of occurrence of the external cause: Secondary | ICD-10-CM | POA: Insufficient documentation

## 2012-03-01 DIAGNOSIS — I2510 Atherosclerotic heart disease of native coronary artery without angina pectoris: Secondary | ICD-10-CM | POA: Insufficient documentation

## 2012-03-01 DIAGNOSIS — R413 Other amnesia: Secondary | ICD-10-CM | POA: Insufficient documentation

## 2012-03-01 DIAGNOSIS — Z79899 Other long term (current) drug therapy: Secondary | ICD-10-CM | POA: Insufficient documentation

## 2012-03-01 DIAGNOSIS — Y9389 Activity, other specified: Secondary | ICD-10-CM | POA: Insufficient documentation

## 2012-03-01 DIAGNOSIS — E119 Type 2 diabetes mellitus without complications: Secondary | ICD-10-CM | POA: Insufficient documentation

## 2012-03-01 DIAGNOSIS — E785 Hyperlipidemia, unspecified: Secondary | ICD-10-CM | POA: Insufficient documentation

## 2012-03-01 DIAGNOSIS — Z87440 Personal history of urinary (tract) infections: Secondary | ICD-10-CM | POA: Insufficient documentation

## 2012-03-01 DIAGNOSIS — I10 Essential (primary) hypertension: Secondary | ICD-10-CM | POA: Insufficient documentation

## 2012-03-01 DIAGNOSIS — Z8701 Personal history of pneumonia (recurrent): Secondary | ICD-10-CM | POA: Insufficient documentation

## 2012-03-01 NOTE — ED Notes (Signed)
[facility] called  

## 2012-03-01 NOTE — ED Provider Notes (Signed)
History     CSN: [id number]  Arrival date & time 03/01/12  1518   First MD Initiated Contact with Patient 03/01/12 1552      Chief Complaint  Patient presents with  . Fall    (Consider location/radiation/quality/duration/timing/severity/associated sxs/prior treatment) The history is provided by the EMS personnel and a relative.  Angela [name] is a 78 y.o. female hx of of dementia, DM, HTN, UTI here with possible fall. She is demented and currently resides at a memory care unit in a facility. She is known to try and escape the facility. Today, she went out the back door and was found on the rocks. Patient demented and unable to answer questions. C collar placed by EMS. Not on coumadin, ASA, or plavix currently. Had recent fall with rib fractures.    Level V caveat- dementia. History of daughter.   Past Medical History  Diagnosis Date  . Diabetes mellitus 06/2005    Type II  . Hypertension 1960  . Hyperlipidemia 02/2004  . Memory loss     history of dementia  . CVA (cerebral infarction)     x 3  . Stroke 2001  . Echocardiogram abnormal     a. 09/2003 EF 55-65%, Mild AR;  b. 05/2008 Echo - EF 66-70%, no RWMA, mid-cavity obliteration @ end systole, DD, mild AI/MR/TR, NL RV  . MRI of brain abnormal 09/15/2003    Old lunar infarct, tight L MVERT ART stenosis 40% RICA  . TIA (transient ischemic attack) 11/2007  . Pneumonia 11/2007  . Abnormal head CT 11/06/2007    Old L hemispheric stroke scattered white matter Dz no acute changes  . Abnormal head MRI 12/08/2007    No acute changes  . Abnormal MRA, head 11/08/2007    Stenosis dist R carotid <50% RICA 75-80% LICA <50%  . Chest pain 10/2008    R/O'd Epig. pain, shaking after Morphine  . Cellulitis 07/2009    RLL  . Macular degeneration   . CAD (coronary artery disease)     a. 04/2004 - PCI LAD - cypher DES to prox LAD, Vision BMS to mid-LAD, PTCA to D1;  b. 07/2004 - cath/PCI : LM-NL, LAD 35m ISR (treated with CBA), 70d, LCX minor  irrges, RCA 20p, 30d;  c. 05/2008 Aden MV: EF 72%, no ischemia  . Dementia     Past Surgical History  Procedure Date  . Appendectomy     18 YOA  . Shoulder surgery 2004    Left  . Adenosine myoview 03/08/2004    Mild isch EF 72%  . Cardiac catheterization 04/09/2004    Severe single vessel disease, tx medically  . Cardiac catheterization 05/02/2004    PTCA X 3  . Instent restenosis 5/10 - 07/28/2004    [facility]  . Cardiac catheterization 07/19/2004    EF 65% Instent restenosis PTCA  . Abdominal ct 07/20/2004    Incr in retroperitoneal hemm  . Adenosine myoview 10/17/2004    Mild isch dist ant wall EF 69%  . Adenosine myoview 09/10/2005    Low risk  . Eeg     Pos left frontal temp focus  . Adenosine cardiolite 09/16/2006    No change, normal  . Carotid duplex, bilateral 11/09/2007    40-60% ICA Stenosis L>R  . Modified barium swallow 11/09/2007    No aspiration  . Technetium myoview 05/11/08    Normal  . Lower extremity US 10/26/08    No DVT  . Cataract  Family History  Problem Relation Age of Onset  . Aneurysm Mother   . Alcohol abuse Father   . Learning disabilities Sister   . Heart disease Sister     CAD  . Heart disease Brother   . Alcohol abuse Brother   . Alcohol abuse Brother   . Heart disease Brother     MI x 2    History  Substance Use Topics  . Smoking status: Never Smoker   . Smokeless tobacco: Never Used  . Alcohol Use: No    OB History    Grav Para Term Preterm Abortions TAB SAB Ect Mult Living                  Review of Systems  Unable to perform ROS: Dementia    Allergies  Morphine sulfate  Home Medications   Current Outpatient Rx  Name  Route  Sig  Dispense  Refill  . ACETAMINOPHEN 325 MG PO TABS   Oral   Take 650 mg by mouth every 6 (six) hours as needed. For pain         . CLONIDINE HCL 0.2 MG PO TABS   Oral   Take 0.2 mg by mouth 2 (two) times daily.         Marland Kitchen CRANBERRY 405 MG PO CAPS   Oral   Take 1 capsule by mouth daily.          Marland Kitchen DOCUSATE SODIUM 100 MG PO CAPS   Oral   Take 100 mg by mouth 2 (two) times daily as needed. For stool softner         . LIDOCAINE 5 % EX PTCH   Transdermal   Place 1 patch onto the skin daily. Remove & Discard patch within 12 hours or as directed by MD         . LORAZEPAM 0.5 MG PO TABS   Oral   Take 0.5 mg by mouth 3 (three) times daily.         Marland Kitchen METOPROLOL TARTRATE 25 MG PO TABS   Oral   Take 25 mg by mouth 2 (two) times daily.         . ADULT MULTIVITAMIN W/MINERALS CH   Oral   Take 1 tablet by mouth daily.         Marland Kitchen NITROGLYCERIN 0.4 MG SL SUBL   Sublingual   Place 0.4 mg under the tongue every 5 (five) minutes as needed. For chest pain         . OVER THE COUNTER MEDICATION   Perineal   Place 1 application into the perineum once a week. On Saturday  Mix 1 TBSP of Vinegar with 1 pint of warm water         . OXYCODONE-ACETAMINOPHEN 5-325 MG PO TABS   Oral   Take 1 tablet by mouth every 4 (four) hours as needed for pain.   30 tablet   0   . SENNOSIDES-DOCUSATE SODIUM 8.6-50 MG PO TABS   Oral   Take 1 tablet by mouth 2 (two) times daily.         . SULFAMETHOXAZOLE-TMP DS 800-160 MG PO TABS   Oral   Take 1 tablet by mouth 2 (two) times daily. x7 days stop date 02/27/2012           BP 192/80  Pulse 71  Temp 97.8 F (36.6 C) (Oral)  Resp 16  SpO2 98%  Physical Exam  Nursing note and vitals  reviewed. Constitutional: She is oriented to person, place, and time.       Demented, NAD   HENT:  Head: Normocephalic.  Mouth/Throat: Oropharynx is clear and moist.       No scalp hematoma   Eyes: Conjunctivae normal are normal. Pupils are equal, round, and reactive to light.  Neck: Normal range of motion. Neck supple.       C collar in place. No cervical midline tenderness.   Cardiovascular: Normal rate, regular rhythm and normal heart sounds.   Pulmonary/Chest: Effort normal and breath sounds normal. No respiratory distress. She has no  wheezes. She has no rales.  Abdominal: Soft. Bowel sounds are normal. She exhibits no distension. There is no tenderness. There is no rebound.  Musculoskeletal: Normal range of motion.       No spinal tenderness. Nl ROM bil hips.   Neurological: She is alert and oriented to person, place, and time.  Skin: Skin is warm and dry.  Psychiatric:       Unable     ED Course  Procedures (including critical care time)  Labs Reviewed  GLUCOSE, CAPILLARY - Abnormal; Notable for the following:    Glucose-Capillary 102 (*)     All other components within normal limits   Dg Skull Complete  03/01/2012  *RADIOLOGY REPORT*  Clinical Data: Dementia, fall  SKULL - COMPLETE 4 + VIEW  Comparison: None.  Findings: Osseous demineralization. No calvarial fracture or bone destruction. Visualized paranasal sinuses clear. Patient edentulous.  IMPRESSION: No acute calvarial abnormalities.   Original Report Authenticated By: Ulyses Southward, M.D.    Dg Chest 1 View  03/01/2012  *RADIOLOGY REPORT*  Clinical Data: Fall  CHEST - 1 VIEW  Comparison: 01/21/2012  Findings: Previously seen right eighth, ninth, and tenth rib fractures are not as well visualized, but fracture lines remain faintly visible.  Mild cardiomegaly.  Lungs are grossly clear.  No pleural effusion.  No pneumothorax.  IMPRESSION: No new acute abnormality.  Subacute right eighth, ninth, and tenth rib fractures again noted.   Original Report Authenticated By: Christiana Pellant, M.D.    Dg Cervical Spine Complete  03/01/2012  *RADIOLOGY REPORT*  Clinical Data: Fall, dementia  CERVICAL SPINE - COMPLETE 4+ VIEW  Comparison: None Correlation:  CT cervical spine 10/13/2011  Findings: Osseous demineralization. Prevertebral soft tissues normal thickness. Disc space narrowing with endplate spur formation C3-C4 through C5- C6. Vertebral body heights maintained without fracture or bone destruction. Minimal retrolisthesis C4-C5. Mild scattered facet degenerative changes.  Inferior left cervical foramina incompletely profiled. Mild encroachment upon right C5-C6 neural foramen by uncovertebral hypertrophy. Scattered atherosclerotic calcifications of carotids and aortic arch.  IMPRESSION: Scattered degenerative disc and facet disease changes of the cervical spine. No acute cervical spine abnormalities.   Original Report Authenticated By: Ulyses Southward, M.D.    Dg Pelvis 1-2 Views  03/01/2012  *RADIOLOGY REPORT*  Clinical Data: Fall  PELVIS - 1-2 VIEW  Comparison: CT 07/21/2004  Findings: Right iliac bone island again noted.  Normal visualized bowel gas pattern.  SI joints are unremarkable.  No displaced pelvic fracture.  IMPRESSION: No displaced pelvic fracture.   Original Report Authenticated By: Christiana Pellant, M.D.      No diagnosis found.    MDM  VALERI SULA is a 78 y.o. female here with possible fall. I discussed with daughter regarding getting imaging to r/o fractures. I was considering getting CT head/neck to r/o bleed or fracture but daughter is concerned about the cost. She has  no obvious step off or fractures or hematoma and not on anticoagulants. Will get xrays instead. She is also not more altered than usual and is currently on bactrim for UTI. Will hold off on labs as per family request. Her vitals are stable and she is afebrile. I told the daughter to bring her back if she develops a fever or if she is more altered than usual.   6:46 PM Xray showed no fracture. Daughter will bring her back to nursing home.        Richardean Canal, MD 03/01/12 [phone]

## 2012-03-01 NOTE — ED Notes (Signed)
CBG registered 102 on ED glucometer.

## 2012-03-01 NOTE — ED Notes (Signed)
Pt here via EMS  S/p fall  Pt reside at [facility]  Pt tried to escape from facility today found on abed of rocks pt is demented palvix  D/ced 1 month ago

## 2012-03-01 NOTE — ED Notes (Signed)
ZOX:[id number]<AV> Expected date:03/01/12<BR> Expected time:<BR> Means of arrival:<BR> Comments:<BR> Fall- back and hip pain

## 2012-07-22 ENCOUNTER — Emergency Department (HOSPITAL_COMMUNITY)
Admission: EM | Admit: 2012-07-22 | Discharge: 2012-07-22 | Disposition: A | Discharge status: Home / Self Care | Attending: Emergency Medicine | Admitting: Emergency Medicine

## 2012-07-22 ENCOUNTER — Emergency Department (HOSPITAL_COMMUNITY)

## 2012-07-22 ENCOUNTER — Encounter (HOSPITAL_COMMUNITY): Payer: Self-pay | Admitting: Emergency Medicine

## 2012-07-22 DIAGNOSIS — Y9210 Unspecified residential institution as the place of occurrence of the external cause: Secondary | ICD-10-CM | POA: Insufficient documentation

## 2012-07-22 DIAGNOSIS — Y939 Activity, unspecified: Secondary | ICD-10-CM | POA: Insufficient documentation

## 2012-07-22 DIAGNOSIS — F0390 Unspecified dementia without behavioral disturbance: Secondary | ICD-10-CM | POA: Insufficient documentation

## 2012-07-22 DIAGNOSIS — R296 Repeated falls: Secondary | ICD-10-CM | POA: Insufficient documentation

## 2012-07-22 DIAGNOSIS — Z8639 Personal history of other endocrine, nutritional and metabolic disease: Secondary | ICD-10-CM | POA: Insufficient documentation

## 2012-07-22 DIAGNOSIS — I10 Essential (primary) hypertension: Secondary | ICD-10-CM | POA: Insufficient documentation

## 2012-07-22 DIAGNOSIS — E119 Type 2 diabetes mellitus without complications: Secondary | ICD-10-CM | POA: Insufficient documentation

## 2012-07-22 DIAGNOSIS — W19XXXA Unspecified fall, initial encounter: Secondary | ICD-10-CM

## 2012-07-22 DIAGNOSIS — Z862 Personal history of diseases of the blood and blood-forming organs and certain disorders involving the immune mechanism: Secondary | ICD-10-CM | POA: Insufficient documentation

## 2012-07-22 DIAGNOSIS — Z79899 Other long term (current) drug therapy: Secondary | ICD-10-CM | POA: Insufficient documentation

## 2012-07-22 DIAGNOSIS — S0003XA Contusion of scalp, initial encounter: Secondary | ICD-10-CM | POA: Insufficient documentation

## 2012-07-22 DIAGNOSIS — Z8673 Personal history of transient ischemic attack (TIA), and cerebral infarction without residual deficits: Secondary | ICD-10-CM | POA: Insufficient documentation

## 2012-07-22 DIAGNOSIS — Z9861 Coronary angioplasty status: Secondary | ICD-10-CM | POA: Insufficient documentation

## 2012-07-22 DIAGNOSIS — Z8701 Personal history of pneumonia (recurrent): Secondary | ICD-10-CM | POA: Insufficient documentation

## 2012-07-22 DIAGNOSIS — I2510 Atherosclerotic heart disease of native coronary artery without angina pectoris: Secondary | ICD-10-CM | POA: Insufficient documentation

## 2012-07-22 HISTORY — DX: Depression, unspecified: F32.A

## 2012-07-22 HISTORY — DX: Occlusion and stenosis of unspecified carotid artery: I65.29

## 2012-07-22 HISTORY — DX: Hemiplegia, unspecified affecting unspecified side: G81.90

## 2012-07-22 HISTORY — DX: Major depressive disorder, single episode, unspecified: F32.9

## 2012-07-22 NOTE — ED Notes (Signed)
Pt family requesting to take pt back to SNF. Stating pt is comfort care only and does not want further tx. Dr.Rancour to d/c pt

## 2012-07-22 NOTE — ED Notes (Signed)
Pt from [facility] with c/o found on floor this morning by staff. Family brought pt in via private vehicle. Pt has bruising to back of head. Family reports pt is very sleepy at present. Unknown how long pt was on ground. Family reports pt seems a little more confused than usual.

## 2012-07-22 NOTE — Progress Notes (Signed)
Room [facility] ED D-31 - Richardean Sale - [facility] RN Visit-R.Berea Majkowski RN  Related admission to [facility] diagnosis of Alzheimer's disease.  Pt is DNR code with OOF DNR and MOST form on shadow chart in ED.    Pt alert, lethargic and confused. Pt lying on ED stretcher, without complaints of pain or discomfort-pt denies headache.   Dtr Zella Ball present. Patient's home medication list is on shadow chart.   Pt found down, unsure of reason and mechanics of fall.  Per dtr, pt refuses to use walker or wheelchair mainly due to forgetfulness.  Dtr states she and her sister make decisions and declined any lab work up, only the CT head that shows no fracture or ICH.  Pt to be discharged back to [facility] with dtr Zella Ball to transport pt.  [facility] RN - Misty Stanley texted to be aware of pending discharge.   Please call [facility] @ [phone]- ask for RN Liaison or after hours,ask for on-call RN with any hospice needs.   Thank you.  Joneen Boers, RN  [facility]  Hospice Liaison

## 2012-07-22 NOTE — ED Provider Notes (Signed)
I saw and evaluated the patient, reviewed the resident's note and I agree with the findings and plan. If applicable, I agree with the resident's interpretation of the EKG.  If applicable, I was present for critical portions of any procedures performed.  Demented patient from nursing home presenting with scalp hematoma after being found on the floor this morning. History of frequent falls but none witness this time. Family reports she is at her baseline mental status.   CT head is stable. Patient is moving all extremities and following commands. occiptal hematoma with 3/6 systolic murmur.  Daughter at bedside reports patient is DO NOT RESUSCITATE, DO NOT INTUBATE and comfort care. She does not want any further evaluation of this fall. She understands that this may not been a fall and other etiologies such as syncope, seizure, infection have not been addressed. Daughter states she has not been told about patient's heart murmur. If she has aortic stenosis, syncope would need to be considered. However she does not desire further workup.  She states she does not want any further testing and would like patient returned to facility. Refuses EKG, UA, CBG.    Glynn Octave, MD 07/22/12 [phone]

## 2012-07-22 NOTE — ED Provider Notes (Signed)
History     CSN: [id number]  Arrival date & time 07/22/12  0911   First MD Initiated Contact with Patient 07/22/12 [phone]      Chief Complaint  Patient presents with  . Head Injury  . Fall    (Consider location/radiation/quality/duration/timing/severity/associated sxs/prior treatment) HPI Comments: Angela [name] is a 79 year old white elderly female with PMH of DM2, CHF, multiple CVA and TIA, HTN, and advanced dementia who resides in a [facility] memory facility ([facility]) presenting to the ED alongside her daughter s/p being found on the floor near her bed by NH staff this morning.  The daughter, Angela [name], who is primary caretaker and POA explains that she was found on the floor around 8am this morning by staff.  She does not know if Angela [name] lost consciousness or how she fell but she suspects that she was trying to walk or get out of bed on her own and hit her head on the nightstand or floor.  She reports a "bump and bruising" on Angela [name]'s head and that she was complaining of tingling and slight pain at the site earlier.  Angela [name] explains that per family and patient wishes Angela [name] is FULL COMFORT CARE and DNR/DNI.  She does not wish for any intervention to further take place and wishes to take her back to the facility to continue to make her as comfortable as possible.  She does not wish to have any further imaging, labs, or medical treatment at this time.  She needs assistance with her ADLs but does talk with ease with increasing confusion over time and is mobile but unsteady on her feet and needs a walker or cane for support that she does not always use.  The daughter also explains that she has been having a lot of falls lately, mainly due to her walking without assistance and that she suspects she may have had another stroke or TIA 6 weeks ago after which she was weaker on the right side, leaning to the right, and had mild facial drooping and drooling.  Currently, she is leaning towards  the left with no facial droop or drool.    At the time of my exam, Angela [name] is awake, leaning towards the left, and appropriately answering questions.  She is alert and awake and oriented to self and city.  She does not remember falling but does remember being on the ground.  She has no complaints at this time.  She denies any headache, fever, chills, N/V/D, chest pain, SOB, abdominal pain, or any urinary complaints at this time.  She does say her head is tingling a little and hurts when someone touches the back of her head but that it will go away.    Patient is a 79 y.o. female presenting with head injury and fall. The history is provided by the patient and a relative (daughter Angela [name]). No language interpreter was used.  Head Injury Location:  Occipital Time since incident:  2 hours Mechanism of injury: fall   Pain details:    Quality:  Aching   Pain severity now: tender to palpation.   Duration:  2 hours   Timing:  Constant   Progression:  Unchanged Chronicity:  New Relieved by:  Nothing Worsened by:  Nothing tried Ineffective treatments:  None tried Associated symptoms: no blurred vision, no nausea, no neck pain and no vomiting   Associated symptoms comment:  Baseline dementia.  ?memory loss Risk factors: being elderly   Risk factors comment:  Dementia, hx of multiple falls Fall Pertinent negatives include no fever, no nausea and no vomiting.    Past Medical History  Diagnosis Date  . Diabetes mellitus 06/2005    Type II  . Hypertension 1960  . Hyperlipidemia 02/2004  . Memory loss     history of dementia  . CVA (cerebral infarction)     x 3  . Stroke 2001  . Echocardiogram abnormal     a. 09/2003 EF 55-65%, Mild AR;  b. 05/2008 Echo - EF 66-70%, no RWMA, mid-cavity obliteration @ end systole, DD, mild AI/MR/TR, NL RV  . MRI of brain abnormal 09/15/2003    Old lunar infarct, tight L MVERT ART stenosis 40% RICA  . TIA (transient ischemic attack) 11/2007  . Pneumonia  11/2007  . Abnormal head CT 11/06/2007    Old L hemispheric stroke scattered white matter Dz no acute changes  . Abnormal head MRI 12/08/2007    No acute changes  . Abnormal MRA, head 11/08/2007    Stenosis dist R carotid <50% RICA 75-80% LICA <50%  . Chest pain 10/2008    R/O'd Epig. pain, shaking after Morphine  . Cellulitis 07/2009    RLL  . Macular degeneration   . CAD (coronary artery disease)     a. 04/2004 - PCI LAD - cypher DES to prox LAD, Vision BMS to mid-LAD, PTCA to D1;  b. 07/2004 - cath/PCI : LM-NL, LAD 47m ISR (treated with CBA), 70d, LCX minor irrges, RCA 20p, 30d;  c. 05/2008 [facility]: EF 72%, no ischemia  . Dementia   . Dementia   . Carotid stenosis   . Hemiparesis   . Depression     Past Surgical History  Procedure Laterality Date  . Appendectomy      18 YOA  . Shoulder surgery  2004    Left  . Adenosine myoview  03/08/2004    Mild isch EF 72%  . Cardiac catheterization  04/09/2004    Severe single vessel disease, tx medically  . Cardiac catheterization  05/02/2004    PTCA X 3  . Instent restenosis  5/10 - 07/28/2004    [facility]  . Cardiac catheterization  07/19/2004    EF 65% Instent restenosis PTCA  . Abdominal ct  07/20/2004    Incr in retroperitoneal hemm  . Adenosine myoview  10/17/2004    Mild isch dist ant wall EF 69%  . Adenosine myoview  09/10/2005    Low risk  . Eeg      Pos left frontal temp focus  . Adenosine cardiolite  09/16/2006    No change, normal  . Carotid duplex, bilateral  11/09/2007    40-60% ICA Stenosis L>R  . Modified barium swallow  11/09/2007    No aspiration  . Technetium myoview  05/11/08    Normal  . Lower extremity US  10/26/08    No DVT  . Cataract    . Heart stent      Family History  Problem Relation Age of Onset  . Aneurysm Mother   . Alcohol abuse Father   . Learning disabilities Sister   . Heart disease Sister     CAD  . Heart disease Brother   . Alcohol abuse Brother   . Alcohol abuse Brother   . Heart disease Brother      MI x 2    History  Substance Use Topics  . Smoking status: Never Smoker   . Smokeless tobacco: Never Used  .  Alcohol Use: No    OB History   Grav Para Term Preterm Abortions TAB SAB Ect Mult Living                  Review of Systems  Constitutional: Positive for appetite change. Negative for fever and chills.       Decreased appetite   HENT: Negative for neck pain.        Baseline difficulty hearing. +occipital hematoma, tender to palpation.  No visible laceration  Eyes: Negative.  Negative for blurred vision.  Respiratory: Negative.   Cardiovascular: Negative.   Gastrointestinal: Negative for nausea and vomiting.  Endocrine: Negative.   Genitourinary: Negative.   Musculoskeletal: Negative.   Skin: Negative.   Allergic/Immunologic: Negative.   Neurological: Positive for tremors and weakness.       Baseline dementia, generalized weakness, hx of CVA and TIA  Hematological: Negative.   Psychiatric/Behavioral: Positive for confusion.       Baseline dementia    Allergies  Morphine sulfate  Home Medications   Current Outpatient Rx  Name  Route  Sig  Dispense  Refill  . acetaminophen (TYLENOL) 325 MG tablet   Oral   Take 650 mg by mouth 2 (two) times daily. For pain         . Cranberry 405 MG CAPS   Oral   Take 1 capsule by mouth daily.         Marland Kitchen docusate sodium (COLACE) 100 MG capsule   Oral   Take 100 mg by mouth 2 (two) times daily as needed. For stool softner         . haloperidol (HALDOL) 0.5 MG tablet   Oral   Take 0.5 mg by mouth daily at 12 noon.         Marland Kitchen OLANZapine (ZYPREXA) 5 MG tablet   Oral   Take 5 mg by mouth 2 (two) times daily.         Marland Kitchen senna-docusate (SENOKOT-S) 8.6-50 MG per tablet   Oral   Take 2 tablets by mouth daily as needed for constipation.            BP 198/69  Pulse 69  Temp(Src) 98.6 F (37 C) (Oral)  Resp 18  SpO2 96%  Physical Exam  Constitutional: She appears well-developed and well-nourished.   Leaning towards the left  HENT:  Head: Normocephalic.  + occipital hematoma, tender to palpation. No visible laceration  Eyes: EOM are normal. Pupils are equal, round, and reactive to light.  Neck: Normal range of motion. Neck supple.  Cardiovascular: Normal rate, regular rhythm and intact distal pulses.   Murmur heard. + 3/6 SEM loudest at upper sternal border radiating to b/l carotids  Pulmonary/Chest: Effort normal and breath sounds normal. No respiratory distress. She has no wheezes. She exhibits no tenderness.  Abdominal: Soft. Bowel sounds are normal. She exhibits no distension. There is no tenderness.  Musculoskeletal: She exhibits edema and tenderness.  Moving all 4 extremities, limited ability to raise right arm in the air.  +non-pitting b/l lower extremity edema. Tender to palpation b/l lower extremities.  Multiple ecchymosis and dry skin b/l extremities.   Neurological: She is alert.  Oriented to person and city.  Awake. Strength 3-4/5 b/l upper and lower extremities. Leaning towards the left.  Sensation grossly intact.  Unable to wrinkle forward but likely due to lack of understanding with dementia, but does puff cheeks, show teeth, close eyes tightly, shrug shoulders with no difficulty.  Skin: Skin is warm and dry.  Occipital hematoma  Psychiatric: She has a normal mood and affect.  Baseline advanced dementia    ED Course  Procedures (including critical care time)  Labs Reviewed  URINALYSIS, ROUTINE W REFLEX MICROSCOPIC  CBC WITH DIFFERENTIAL  COMPREHENSIVE METABOLIC PANEL  TROPONIN I   Ct Head Wo Contrast  07/22/2012   *RADIOLOGY REPORT*  Clinical Data: Found on floor.  CT HEAD WITHOUT CONTRAST  Technique:  Contiguous axial images were obtained from the base of the skull through the vertex without contrast.  Comparison: 10/13/2011.  Findings: Posterior left parietal scalp hematoma without underlying fracture or intracranial hemorrhage.  Remote left basal ganglia  infarct with dilated left lateral ventricle.  Prominent small vessel disease type changes without CT evidence of large acute infarct.  Atrophy.  Ventricular prominence felt to be related to atrophy and result of prior infarct rather than hydrocephalus.  No intracranial mass lesion detected on this unenhanced exam.  Mild mucosal thickening ethmoid sinus air cells.  Vascular calcifications.  IMPRESSION: Posterior left parietal scalp hematoma without underlying fracture or intracranial hemorrhage.  Please see above.   Original Report Authenticated By: Lacy Duverney, M.D.     No diagnosis found.    MDM  Angela [name] is a 79 year old elderly female with advanced dementia and hx of multiple CVAs ans TIAs, HTN, DM2, and CHF presenting s/p fall and being found on the floor by NH staff.  Found to have a occipital hematoma and CT head confirmed posterior left parietal scalp hematoma without underlying fracture or intracranial hemorrhage with remote L BG infarct and dilated LV, atrophy, and vascular calcifications.   Daughter, Angela [name] is primary care taker and POA.  Explains patient is FULL COMFORT CARE AND DNR/DNI and does not wish for any further medical intervention to take place at this time.  Refused all lab draws, any medications, U/A, CBG check, EKG and any further imaging if necessary.  She wishes to return her back to [facility] and make her mother as comfortable as possible.  -d/c back to NH with daughter -f/u pcp  Case discussed with Dr. Carmie End, MD 07/22/12 1102  Darden Palmer, MD 07/22/12 1102

## 2012-07-22 NOTE — ED Notes (Signed)
NAD noted at time of d/c back to SNF

## 2012-10-08 ENCOUNTER — Encounter (HOSPITAL_COMMUNITY): Payer: Self-pay

## 2012-10-08 ENCOUNTER — Emergency Department (HOSPITAL_COMMUNITY)
Admission: EM | Admit: 2012-10-08 | Discharge: 2012-10-08 | Disposition: A | Discharge status: Home / Self Care | Attending: Emergency Medicine | Admitting: Emergency Medicine

## 2012-10-08 DIAGNOSIS — Z79899 Other long term (current) drug therapy: Secondary | ICD-10-CM | POA: Insufficient documentation

## 2012-10-08 DIAGNOSIS — Z8673 Personal history of transient ischemic attack (TIA), and cerebral infarction without residual deficits: Secondary | ICD-10-CM | POA: Insufficient documentation

## 2012-10-08 DIAGNOSIS — Z8679 Personal history of other diseases of the circulatory system: Secondary | ICD-10-CM | POA: Insufficient documentation

## 2012-10-08 DIAGNOSIS — E785 Hyperlipidemia, unspecified: Secondary | ICD-10-CM | POA: Insufficient documentation

## 2012-10-08 DIAGNOSIS — Z8701 Personal history of pneumonia (recurrent): Secondary | ICD-10-CM | POA: Insufficient documentation

## 2012-10-08 DIAGNOSIS — F329 Major depressive disorder, single episode, unspecified: Secondary | ICD-10-CM | POA: Insufficient documentation

## 2012-10-08 DIAGNOSIS — Z8669 Personal history of other diseases of the nervous system and sense organs: Secondary | ICD-10-CM | POA: Insufficient documentation

## 2012-10-08 DIAGNOSIS — I2510 Atherosclerotic heart disease of native coronary artery without angina pectoris: Secondary | ICD-10-CM | POA: Insufficient documentation

## 2012-10-08 DIAGNOSIS — F3289 Other specified depressive episodes: Secondary | ICD-10-CM | POA: Insufficient documentation

## 2012-10-08 DIAGNOSIS — W19XXXA Unspecified fall, initial encounter: Secondary | ICD-10-CM

## 2012-10-08 DIAGNOSIS — S0003XA Contusion of scalp, initial encounter: Secondary | ICD-10-CM | POA: Insufficient documentation

## 2012-10-08 DIAGNOSIS — F0390 Unspecified dementia without behavioral disturbance: Secondary | ICD-10-CM | POA: Insufficient documentation

## 2012-10-08 DIAGNOSIS — I10 Essential (primary) hypertension: Secondary | ICD-10-CM | POA: Insufficient documentation

## 2012-10-08 DIAGNOSIS — Y939 Activity, unspecified: Secondary | ICD-10-CM | POA: Insufficient documentation

## 2012-10-08 DIAGNOSIS — G8190 Hemiplegia, unspecified affecting unspecified side: Secondary | ICD-10-CM | POA: Insufficient documentation

## 2012-10-08 DIAGNOSIS — E119 Type 2 diabetes mellitus without complications: Secondary | ICD-10-CM | POA: Insufficient documentation

## 2012-10-08 DIAGNOSIS — Z872 Personal history of diseases of the skin and subcutaneous tissue: Secondary | ICD-10-CM | POA: Insufficient documentation

## 2012-10-08 DIAGNOSIS — S1093XA Contusion of unspecified part of neck, initial encounter: Secondary | ICD-10-CM | POA: Insufficient documentation

## 2012-10-08 DIAGNOSIS — T148XXA Other injury of unspecified body region, initial encounter: Secondary | ICD-10-CM

## 2012-10-08 DIAGNOSIS — W06XXXA Fall from bed, initial encounter: Secondary | ICD-10-CM | POA: Insufficient documentation

## 2012-10-08 DIAGNOSIS — Y9210 Unspecified residential institution as the place of occurrence of the external cause: Secondary | ICD-10-CM | POA: Insufficient documentation

## 2012-10-08 NOTE — ED Provider Notes (Signed)
79 year old female with severe dementia was brought in by her daughter after having a fall. She has a small hematoma in the right side of her forehead and abrasion on the bridge of her nose. She does not have any focal neurologic findings and daughter states that her mental state is at her baseline. She is under hospice care and daughter is requesting that no testing such as CT scan be obtained because she would not authorize any kind of intervention no matter what was found. This seemed to be a reasonable request so CTs were not obtained. Daughter will do understand that there could be a subdural hematoma present which we would not know about it therefore would not be able to do anything to treat and she is very comfortable with this.  Medical screening examination/treatment/procedure(s) were conducted as a shared visit with non-physician practitioner(s) and myself.  I personally evaluated the patient during the encounter   Dione Booze, MD 10/08/12 [phone]

## 2012-10-08 NOTE — ED Notes (Signed)
Per daughter at bedside pt lives at [facility] and fell this am while trying to get out of the bed. This was an un witnessed fall, pt found on the floor by staff outside of her room. Pt has dementia, pt denies remembering fall. Staff reported pt was conscious and alert per her norm when found on floor. Per daughter pt is a/o per her norm, recognized daughter upon her arrival. Pt presents with a hematoma to forehead and abrasions to nose. Daughter at bedside is POA and refusing blood work or fluids. She is requesting an evaluation and then send home, states pt is a DNR and under care of Hospice

## 2012-10-08 NOTE — ED Provider Notes (Signed)
CSN: [id number]     Arrival date & time 10/08/12  4098 History     First MD Initiated Contact with Patient 10/08/12 0631     Chief Complaint  Patient presents with  . Fall   (Consider location/radiation/quality/duration/timing/severity/associated sxs/prior Treatment) HPI  Angela [name] is a(n) 79 y.o. female who has end-stage dementia, living in memory care facility and on palliative care.  She is brought in by her daughter this morning after fall. Level 5 caveat. She was found on her floor by nursing staff around 4:30 AM this morning.  She was on the floor for an unknown amount of time.  She is a small hematoma over the right brow and abrasion on the bridge of the nose.  Daughter states that she appears to be at her baseline mental status.  She has not complained of any pain.  The daughter states that they have a MOST form. Patient is DNR/DNI. She re requesting minimal workup be performed as she would not authorize any kind of surgical intervention he should intracranial pathology  Be found.  She also states that she understands that there may be medical causes such as urinary tract infection, possible rhabdomyolysis. Daughter states she is aware and again requests no further work up. Patient is seen 1/week by pcp at faclilty.   Past Medical History  Diagnosis Date  . Diabetes mellitus 06/2005    Type II  . Hypertension 1960  . Hyperlipidemia 02/2004  . Memory loss     history of dementia  . CVA (cerebral infarction)     x 3  . Stroke 2001  . Echocardiogram abnormal     a. 09/2003 EF 55-65%, Mild AR;  b. 05/2008 Echo - EF 66-70%, no RWMA, mid-cavity obliteration @ end systole, DD, mild AI/MR/TR, NL RV  . MRI of brain abnormal 09/15/2003    Old lunar infarct, tight L MVERT ART stenosis 40% RICA  . TIA (transient ischemic attack) 11/2007  . Pneumonia 11/2007  . Abnormal head CT 11/06/2007    Old L hemispheric stroke scattered white matter Dz no acute changes  . Abnormal head MRI  12/08/2007    No acute changes  . Abnormal MRA, head 11/08/2007    Stenosis dist R carotid <50% RICA 75-80% LICA <50%  . Chest pain 10/2008    R/O'd Epig. pain, shaking after Morphine  . Cellulitis 07/2009    RLL  . Macular degeneration   . CAD (coronary artery disease)     a. 04/2004 - PCI LAD - cypher DES to prox LAD, Vision BMS to mid-LAD, PTCA to D1;  b. 07/2004 - cath/PCI : LM-NL, LAD 27m ISR (treated with CBA), 70d, LCX minor irrges, RCA 20p, 30d;  c. 05/2008 [facility] MV: EF 72%, no ischemia  . Dementia   . Dementia   . Carotid stenosis   . Hemiparesis   . Depression    Past Surgical History  Procedure Laterality Date  . Appendectomy      18 YOA  . Shoulder surgery  2004    Left  . Adenosine myoview  03/08/2004    Mild isch EF 72%  . Cardiac catheterization  04/09/2004    Severe single vessel disease, tx medically  . Cardiac catheterization  05/02/2004    PTCA X 3  . Instent restenosis  5/10 - 07/28/2004    [facility]  . Cardiac catheterization  07/19/2004    EF 65% Instent restenosis PTCA  . Abdominal ct  07/20/2004  Incr in retroperitoneal hemm  . Adenosine myoview  10/17/2004    Mild isch dist ant wall EF 69%  . Adenosine myoview  09/10/2005    Low risk  . Eeg      Pos left frontal temp focus  . Adenosine cardiolite  09/16/2006    No change, normal  . Carotid duplex, bilateral  11/09/2007    40-60% ICA Stenosis L>R  . Modified barium swallow  11/09/2007    No aspiration  . Technetium myoview  05/11/08    Normal  . Lower extremity US  10/26/08    No DVT  . Cataract    . Heart stent     Family History  Problem Relation Age of Onset  . Aneurysm Mother   . Alcohol abuse Father   . Learning disabilities Sister   . Heart disease Sister     CAD  . Heart disease Brother   . Alcohol abuse Brother   . Alcohol abuse Brother   . Heart disease Brother     MI x 2   History  Substance Use Topics  . Smoking status: Never Smoker   . Smokeless tobacco: Never Used  . Alcohol Use: No    OB History   Grav Para Term Preterm Abortions TAB SAB Ect Mult Living                 Review of Systems  Unable to perform ROS: Dementia      Allergies  Morphine sulfate  Home Medications   Current Outpatient Rx  Name  Route  Sig  Dispense  Refill  . acetaminophen (TYLENOL) 325 MG tablet   Oral   Take 650 mg by mouth 2 (two) times daily. For pain         . Cranberry 405 MG CAPS   Oral   Take 1 capsule by mouth daily.         Marland Kitchen docusate sodium (COLACE) 100 MG capsule   Oral   Take 100 mg by mouth 2 (two) times daily as needed. For stool softner         . haloperidol (HALDOL) 0.5 MG tablet   Oral   Take 0.5 mg by mouth daily at 12 noon.         Marland Kitchen OLANZapine (ZYPREXA) 5 MG tablet   Oral   Take 5 mg by mouth 2 (two) times daily.         Marland Kitchen senna-docusate (SENOKOT-S) 8.6-50 MG per tablet   Oral   Take 2 tablets by mouth daily as needed for constipation.           BP 196/69  Temp(Src) 98.1 F (36.7 C) (Oral)  Resp 18  SpO2 98% Physical Exam  Nursing note and vitals reviewed. Constitutional: She is oriented to person, place, and time. No distress.  Elderly female in NAD. Resting comfortably, sleepy but arousable and answers yes and  No questions.   HENT:  Head: Normocephalic.    Eyes: Conjunctivae are normal. No scleral icterus.  Neck: Trachea normal, normal range of motion, full passive range of motion without pain and phonation normal. Neck supple. Carotid bruit is present (left).  No midline tenderness of neck.  Patient does not withdraw from pain over the brow neck or nose appear.  Cardiovascular: Normal rate, regular rhythm and normal heart sounds.  Exam reveals no gallop and no friction rub.   No murmur heard. Pulmonary/Chest: Effort normal and breath sounds normal. No respiratory distress.  Abdominal: Soft. Bowel sounds are normal. She exhibits no distension and no mass. There is no tenderness. There is no guarding.  Neurological: She is  alert and oriented to person, place, and time.  Skin: Skin is warm and dry.    ED Course   Procedures (including critical care time)  Labs Reviewed - No data to display No results found. 1. Fall, initial encounter   2. Hematoma     MDM  7:15 AM BP 196/69  Temp(Src) 98.1 F (36.7 C) (Oral)  Resp 18  SpO2 98% Patient with fall, hematoma. Due to nature of end of life care, minimal work up and assement performed. This seems reasonable considering expectations of family. Patient seen in shared visit with Dr. Preston Fleeting who agrees with plan of care. The patient appears reasonably screened and/or stabilized for discharge and I doubt any other medical condition or other [facility] requiring further screening, evaluation, or treatment in the ED at this time prior to discharge.   Arthor Captain, PA-C 10/08/12 [phone]

## 2012-10-08 NOTE — ED Notes (Signed)
Daughter with mother.  Pt given beverage before departure.

## 2012-10-08 NOTE — ED Notes (Signed)
EDPA Harris at bedside. 

## 2013-03-08 ENCOUNTER — Encounter: Payer: Self-pay | Admitting: Podiatry

## 2013-03-08 ENCOUNTER — Ambulatory Visit (INDEPENDENT_AMBULATORY_CARE_PROVIDER_SITE_OTHER): Payer: Medicare Other | Admitting: Podiatry

## 2013-03-08 VITALS — BP 106/68 | HR 60 | Resp 12 | Ht 62.0 in | Wt 140.0 lb

## 2013-03-08 DIAGNOSIS — Q828 Other specified congenital malformations of skin: Secondary | ICD-10-CM

## 2013-03-08 DIAGNOSIS — M2040 Other hammer toe(s) (acquired), unspecified foot: Secondary | ICD-10-CM

## 2013-03-08 DIAGNOSIS — B351 Tinea unguium: Secondary | ICD-10-CM

## 2013-03-08 DIAGNOSIS — M79609 Pain in unspecified limb: Secondary | ICD-10-CM

## 2013-03-08 DIAGNOSIS — E1159 Type 2 diabetes mellitus with other circulatory complications: Secondary | ICD-10-CM

## 2013-03-08 NOTE — Progress Notes (Signed)
N-SORE L-B/L TOENAILS D-1 MONTH O-SLOWLY C-SLOWLY A-SHOES T-SOAK, TRIM

## 2013-03-08 NOTE — Progress Notes (Signed)
Subjective:     Patient ID: Angela [name], female   DOB: [birth year], 79 y.o.   MRN: [id number]  HPI patient presents with caregiver who states that she has complaints of her second and third toes right and her nails are elongated and she says they are tender and they can not cut them   Review of Systems  All other systems reviewed and are negative.       Objective:   Physical Exam  Nursing note and vitals reviewed. Cardiovascular: Intact distal pulses.   Musculoskeletal: Normal range of motion.  Skin: Skin is dry.   patient is not well oriented secondary to long-term Alzheimer's and does have diminishment of neurovascular disease with mild equinus and muscle strength that is reduced of both feet. Keratotic lesions distal second and third toes right and nail disease 1-5 both feet that our painful     Assessment:     At wrist diabetic with porokeratotic distal lesion second and third toe right associated with hammertoe and nail disease 1-5 both feet    Plan:     Debridement nailbeds 1-5 both feet with no iatrogenic bleeding noted and debridement lesions right foot with no iatrogenic bleeding noted

## 2013-03-08 NOTE — Progress Notes (Signed)
   Subjective:    Patient ID: Angela [name], female    DOB: [birth year], 79 y.o.   MRN: [id number]  HPI Comments: '' TOENAILS ARE SORE.''     Review of Systems  Respiratory: Positive for wheezing.   Cardiovascular: Positive for leg swelling.  Musculoskeletal: Positive for gait problem.  Hematological: Bruises/bleeds easily.  Psychiatric/Behavioral: Positive for hallucinations, behavioral problems and confusion.  All other systems reviewed and are negative.       Objective:   Physical Exam        Assessment & Plan:

## 2013-05-06 ENCOUNTER — Emergency Department (HOSPITAL_COMMUNITY): Payer: Medicare Other

## 2013-05-06 ENCOUNTER — Emergency Department (HOSPITAL_COMMUNITY)
Admission: EM | Admit: 2013-05-06 | Discharge: 2013-05-07 | Disposition: A | Discharge status: Home / Self Care | Payer: Medicare Other | Attending: Emergency Medicine | Admitting: Emergency Medicine

## 2013-05-06 ENCOUNTER — Encounter (HOSPITAL_COMMUNITY): Payer: Self-pay | Admitting: Emergency Medicine

## 2013-05-06 ENCOUNTER — Other Ambulatory Visit: Payer: Self-pay

## 2013-05-06 DIAGNOSIS — Z8669 Personal history of other diseases of the nervous system and sense organs: Secondary | ICD-10-CM | POA: Insufficient documentation

## 2013-05-06 DIAGNOSIS — F329 Major depressive disorder, single episode, unspecified: Secondary | ICD-10-CM | POA: Insufficient documentation

## 2013-05-06 DIAGNOSIS — S0083XA Contusion of other part of head, initial encounter: Principal | ICD-10-CM

## 2013-05-06 DIAGNOSIS — S0003XA Contusion of scalp, initial encounter: Secondary | ICD-10-CM | POA: Insufficient documentation

## 2013-05-06 DIAGNOSIS — E119 Type 2 diabetes mellitus without complications: Secondary | ICD-10-CM | POA: Insufficient documentation

## 2013-05-06 DIAGNOSIS — Z8639 Personal history of other endocrine, nutritional and metabolic disease: Secondary | ICD-10-CM | POA: Insufficient documentation

## 2013-05-06 DIAGNOSIS — F3289 Other specified depressive episodes: Secondary | ICD-10-CM | POA: Insufficient documentation

## 2013-05-06 DIAGNOSIS — Z862 Personal history of diseases of the blood and blood-forming organs and certain disorders involving the immune mechanism: Secondary | ICD-10-CM | POA: Insufficient documentation

## 2013-05-06 DIAGNOSIS — Y939 Activity, unspecified: Secondary | ICD-10-CM | POA: Insufficient documentation

## 2013-05-06 DIAGNOSIS — N390 Urinary tract infection, site not specified: Secondary | ICD-10-CM | POA: Insufficient documentation

## 2013-05-06 DIAGNOSIS — I10 Essential (primary) hypertension: Secondary | ICD-10-CM | POA: Insufficient documentation

## 2013-05-06 DIAGNOSIS — Z9889 Other specified postprocedural states: Secondary | ICD-10-CM | POA: Insufficient documentation

## 2013-05-06 DIAGNOSIS — Y929 Unspecified place or not applicable: Secondary | ICD-10-CM | POA: Insufficient documentation

## 2013-05-06 DIAGNOSIS — Z8701 Personal history of pneumonia (recurrent): Secondary | ICD-10-CM | POA: Insufficient documentation

## 2013-05-06 DIAGNOSIS — I2510 Atherosclerotic heart disease of native coronary artery without angina pectoris: Secondary | ICD-10-CM | POA: Insufficient documentation

## 2013-05-06 DIAGNOSIS — W19XXXA Unspecified fall, initial encounter: Secondary | ICD-10-CM | POA: Insufficient documentation

## 2013-05-06 DIAGNOSIS — Z872 Personal history of diseases of the skin and subcutaneous tissue: Secondary | ICD-10-CM | POA: Insufficient documentation

## 2013-05-06 DIAGNOSIS — S1093XA Contusion of unspecified part of neck, initial encounter: Principal | ICD-10-CM

## 2013-05-06 DIAGNOSIS — Z79899 Other long term (current) drug therapy: Secondary | ICD-10-CM | POA: Insufficient documentation

## 2013-05-06 DIAGNOSIS — F0390 Unspecified dementia without behavioral disturbance: Secondary | ICD-10-CM | POA: Insufficient documentation

## 2013-05-06 DIAGNOSIS — Z8673 Personal history of transient ischemic attack (TIA), and cerebral infarction without residual deficits: Secondary | ICD-10-CM | POA: Insufficient documentation

## 2013-05-06 LAB — URINALYSIS, ROUTINE W REFLEX MICROSCOPIC
Bilirubin Urine: NEGATIVE
Glucose, UA: NEGATIVE mg/dL
KETONES UR: NEGATIVE mg/dL
NITRITE: NEGATIVE
PH: 6 (ref 5.0–8.0)
Protein, ur: >300 mg/dL — AB
Specific Gravity, Urine: 1.022 (ref 1.005–1.030)
UROBILINOGEN UA: 1 mg/dL (ref 0.0–1.0)

## 2013-05-06 LAB — I-STAT CHEM 8, ED
BUN: 21 mg/dL (ref 6–23)
CALCIUM ION: 1.28 mmol/L (ref 1.13–1.30)
CHLORIDE: 102 meq/L (ref 96–112)
Creatinine, Ser: 1.2 mg/dL — ABNORMAL HIGH (ref 0.50–1.10)
GLUCOSE: 116 mg/dL — AB (ref 70–99)
HEMATOCRIT: 41 % (ref 36.0–46.0)
HEMOGLOBIN: 13.9 g/dL (ref 12.0–15.0)
Potassium: 4.2 mEq/L (ref 3.7–5.3)
Sodium: 142 mEq/L (ref 137–147)
TCO2: 28 mmol/L (ref 0–100)

## 2013-05-06 LAB — URINE MICROSCOPIC-ADD ON

## 2013-05-06 MED ORDER — CEFPODOXIME PROXETIL 100 MG PO TABS: 100.0000 mg | ORAL_TABLET | Freq: Two times a day (BID) | ORAL | Status: DC

## 2013-05-06 MED ORDER — CLINDAMYCIN PHOSPHATE 900 MG/50ML IV SOLN: 900.0000 mg | Freq: Once | INTRAVENOUS | Status: DC

## 2013-05-06 NOTE — Discharge Instructions (Signed)
Fall Prevention and Home Safety Falls cause injuries and can affect all age groups. It is possible to prevent falls.  HOW TO PREVENT FALLS  Wear shoes with rubber soles that do not have an opening for your toes.  Keep the inside and outside of your house well lit.  Use night lights throughout your home.  Remove clutter from floors.  Clean up floor spills.  Remove throw rugs or fasten them to the floor with carpet tape.  Do not place electrical cords across pathways.  Put grab bars by your tub, shower, and toilet. Do not use towel bars as grab bars.  Put handrails on both sides of the stairway. Fix loose handrails.  Do not climb on stools or stepladders, if possible.  Do not wax your floors.  Repair uneven or unsafe sidewalks, walkways, or stairs.  Keep items you use a lot within reach.  Be aware of pets.  Keep emergency numbers next to the telephone.  Put smoke detectors in your home and near bedrooms. Ask your doctor what other things you can do to prevent falls. Document Released: 12/22/2008 Document Revised: 08/27/2011 Document Reviewed: 05/28/2011 [facility] Patient Information 2014 [place], [place].  Facial or Scalp Contusion  A facial or scalp contusion is a deep bruise on the face or head. Contusions happen when an injury causes bleeding under the skin. Signs of bruising include pain, puffiness (swelling), and discolored skin. The contusion may turn blue, purple, or yellow. HOME CARE  Only take medicines as told by your doctor.  Put ice on the injured area.  Put ice in a plastic bag.  Place a towel between your skin and the bag.  Leave the ice on for 20 minutes, 2 3 times a day. GET HELP IF:  You have bite problems.  You have pain when chewing.  You are worried about your face not healing normally. GET HELP RIGHT AWAY IF:   You have severe pain or a headache and medicine does not help.  You are very tired or confused, or your personality  changes.  You throw up (vomit).  You have a nosebleed that will not stop.  You see two of everything (double vision) or have blurry vision.  You have fluid coming from your nose or ear.  You have problems walking or using your arms or legs. MAKE SURE YOU:   Understand these instructions.  Will watch your condition.  Will get help right away if you are not doing well or get worse. Document Released: 02/14/2011 Document Revised: 12/16/2012 Document Reviewed: 10/08/2012 [facility] Patient Information 2014 [place], [place].  Facial or Scalp Contusion  A facial or scalp contusion is a deep bruise on the face or head. Contusions happen when an injury causes bleeding under the skin. Signs of bruising include pain, puffiness (swelling), and discolored skin. The contusion may turn blue, purple, or yellow. HOME CARE  Only take medicines as told by your doctor.  Put ice on the injured area.  Put ice in a plastic bag.  Place a towel between your skin and the bag.  Leave the ice on for 20 minutes, 2 3 times a day. GET HELP IF:  You have bite problems.  You have pain when chewing.  You are worried about your face not healing normally. GET HELP RIGHT AWAY IF:   You have severe pain or a headache and medicine does not help.  You are very tired or confused, or your personality changes.  You throw up (vomit).  You have a nosebleed that will not stop.  You see two of everything (double vision) or have blurry vision.  You have fluid coming from your nose or ear.  You have problems walking or using your arms or legs. MAKE SURE YOU:   Understand these instructions.  Will watch your condition.  Will get help right away if you are not doing well or get worse. Document Released: 02/14/2011 Document Revised: 12/16/2012 Document Reviewed: 10/08/2012 [facility] Patient Information 2014 [place].  Urinary Tract Infection A urinary tract infection (UTI) can occur any place  along the urinary tract. The tract includes the kidneys, ureters, bladder, and urethra. A type of germ called bacteria often causes a UTI. UTIs are often helped with antibiotic medicine.  HOME CARE   If given, take antibiotics as told by your doctor. Finish them even if you start to feel better.  Drink enough fluids to keep your pee (urine) clear or pale yellow.  Avoid tea, drinks with caffeine, and bubbly (carbonated) drinks.  Pee often. Avoid holding your pee in for a long time.  Pee before and after having sex (intercourse).  Wipe from front to back after you poop (bowel movement) if you are a woman. Use each tissue only once. GET HELP RIGHT AWAY IF:   You have back pain.  You have lower belly (abdominal) pain.  You have chills.  You feel sick to your stomach (nauseous).  You throw up (vomit).  Your burning or discomfort with peeing does not go away.  You have a fever.  Your symptoms are not better in 3 days. MAKE SURE YOU:   Understand these instructions.  Will watch your condition.  Will get help right away if you are not doing well or get worse. Document Released: 08/14/2007 Document Revised: 11/20/2011 Document Reviewed: 09/26/2011 [facility] Patient Information 2014 [place], [place].

## 2013-05-06 NOTE — ED Provider Notes (Signed)
CSN: [id number]     Arrival date & time 05/06/13  1956 History   First MD Initiated Contact with Patient 05/06/13 2007     Chief Complaint  Patient presents with  . Fall     Patient is a 80 y.o. female presenting with fall. The history is provided by the nursing home and the EMS personnel.  Fall This is a new problem. The current episode started today. The problem has been resolved. Associated symptoms include headaches. Pertinent negatives include no abdominal pain, anorexia, arthralgias, change in bowel habit, chest pain, chills, congestion, coughing, diaphoresis, fatigue, fever, joint swelling, myalgias, nausea, neck pain, numbness, rash, sore throat, swollen glands, urinary symptoms, vertigo, visual change, vomiting or weakness. Nothing aggravates the symptoms. She has tried nothing for the symptoms.    Past Medical History  Diagnosis Date  . Diabetes mellitus 06/2005    Type II  . Hypertension 1960  . Hyperlipidemia 02/2004  . Memory loss     history of dementia  . CVA (cerebral infarction)     x 3  . Stroke 2001  . Echocardiogram abnormal     a. 09/2003 EF 55-65%, Mild AR;  b. 05/2008 Echo - EF 66-70%, no RWMA, mid-cavity obliteration @ end systole, DD, mild AI/MR/TR, NL RV  . MRI of brain abnormal 09/15/2003    Old lunar infarct, tight L MVERT ART stenosis 40% RICA  . TIA (transient ischemic attack) 11/2007  . Pneumonia 11/2007  . Abnormal head CT 11/06/2007    Old L hemispheric stroke scattered white matter Dz no acute changes  . Abnormal head MRI 12/08/2007    No acute changes  . Abnormal MRA, head 11/08/2007    Stenosis dist R carotid <50% RICA 75-80% LICA <50%  . Chest pain 10/2008    R/O'd Epig. pain, shaking after Morphine  . Cellulitis 07/2009    RLL  . Macular degeneration   . CAD (coronary artery disease)     a. 04/2004 - PCI LAD - cypher DES to prox LAD, Vision BMS to mid-LAD, PTCA to D1;  b. 07/2004 - cath/PCI : LM-NL, LAD 55m ISR (treated with CBA), 70d, LCX minor  irrges, RCA 20p, 30d;  c. 05/2008 Aden MV: EF 72%, no ischemia  . Dementia   . Dementia   . Carotid stenosis   . Hemiparesis   . Depression    Past Surgical History  Procedure Laterality Date  . Appendectomy      18 YOA  . Shoulder surgery  2004    Left  . Adenosine myoview  03/08/2004    Mild isch EF 72%  . Cardiac catheterization  04/09/2004    Severe single vessel disease, tx medically  . Cardiac catheterization  05/02/2004    PTCA X 3  . Instent restenosis  5/10 - 07/28/2004    [facility]  . Cardiac catheterization  07/19/2004    EF 65% Instent restenosis PTCA  . Abdominal ct  07/20/2004    Incr in retroperitoneal hemm  . Adenosine myoview  10/17/2004    Mild isch dist ant wall EF 69%  . Adenosine myoview  09/10/2005    Low risk  . Eeg      Pos left frontal temp focus  . Adenosine cardiolite  09/16/2006    No change, normal  . Carotid duplex, bilateral  11/09/2007    40-60% ICA Stenosis L>R  . Modified barium swallow  11/09/2007    No aspiration  . Technetium myoview  05/11/08  Normal  . Lower extremity US  10/26/08    No DVT  . Cataract    . Heart stent     Family History  Problem Relation Age of Onset  . Aneurysm Mother   . Alcohol abuse Father   . Learning disabilities Sister   . Heart disease Sister     CAD  . Heart disease Brother   . Alcohol abuse Brother   . Alcohol abuse Brother   . Heart disease Brother     MI x 2   History  Substance Use Topics  . Smoking status: Never Smoker   . Smokeless tobacco: Never Used  . Alcohol Use: No   OB History   Grav Para Term Preterm Abortions TAB SAB Ect Mult Living                 Review of Systems  Constitutional: Negative for fever, chills, diaphoresis and fatigue.  HENT: Negative for congestion and sore throat.   Respiratory: Negative for cough.   Cardiovascular: Negative for chest pain.  Gastrointestinal: Negative for nausea, vomiting, abdominal pain, anorexia and change in bowel habit.  Musculoskeletal: Negative  for arthralgias, joint swelling, myalgias and neck pain.  Skin: Negative for rash.  Neurological: Positive for headaches. Negative for vertigo, weakness and numbness.      Allergies  Morphine sulfate  Home Medications   Current Outpatient Rx  Name  Route  Sig  Dispense  Refill  . acetaminophen (TYLENOL) 500 MG tablet   Oral   Take 500 mg by mouth every 6 (six) hours as needed for moderate pain.         Marland Kitchen guaiFENesin (ROBITUSSIN) 100 MG/5ML SOLN   Oral   Take 5 mLs by mouth every 4 (four) hours as needed for cough or to loosen phlegm.         . haloperidol (HALDOL) 0.5 MG tablet   Oral   Take 0.5 mg by mouth 2 (two) times daily.         . povidone-iodine (BETADINE) 10 % external solution   Topical   Apply 1 application topically 2 (two) times daily. Started 04/30/13, for 14 days ending 05/11/13         . acetaminophen (TYLENOL) 325 MG tablet   Oral   Take 650 mg by mouth 2 (two) times daily. For pain         . Cranberry 405 MG CAPS   Oral   Take by mouth.         Tery Sanfilippo Sodium 100 MG/5ML ENEM   Rectal   Place rectally.         . halobetasol (ULTRAVATE) 0.05 % cream   Topical   Apply topically 2 (two) times daily.         Marland Kitchen OLANZapine (ZYPREXA) 5 MG tablet   Oral   Take 2.5 mg by mouth at bedtime.           BP 186/63  Pulse 52  Temp(Src) 97.7 F (36.5 C) (Oral)  Resp 16  SpO2 95% Physical Exam  Constitutional: She appears well-developed and well-nourished. No distress.  HENT:  Head: Normocephalic.  Nose: Nose normal.  Mouth/Throat: Oropharynx is clear and moist.  occipital scalp hematoma  Eyes: EOM are normal. Pupils are equal, round, and reactive to light.  Neck: Normal range of motion. Neck supple. No tracheal deviation present.  Cardiovascular: Normal rate, regular rhythm, normal heart sounds and intact distal pulses.   Pulmonary/Chest: Effort normal  and breath sounds normal. She has no rales.  Abdominal: Soft. Bowel sounds are  normal. She exhibits no distension. There is no tenderness. There is no rebound and no guarding.  Musculoskeletal: Normal range of motion. She exhibits no tenderness.  Neurological: She is alert.  Oriented to person - this is baseline for patient  Skin: Skin is warm and dry. No rash noted.  Psychiatric: She has a normal mood and affect. Her behavior is normal.    ED Course  Procedures (including critical care time) Labs Review  Results for orders placed during the hospital encounter of 05/06/13  URINALYSIS, ROUTINE W REFLEX MICROSCOPIC      Result Value Ref Range   Color, Urine YELLOW  YELLOW   APPearance TURBID (*) CLEAR   Specific Gravity, Urine 1.022  1.005 - 1.030   pH 6.0  5.0 - 8.0   Glucose, UA NEGATIVE  NEGATIVE mg/dL   Hgb urine dipstick LARGE (*) NEGATIVE   Bilirubin Urine NEGATIVE  NEGATIVE   Ketones, ur NEGATIVE  NEGATIVE mg/dL   Protein, ur >161>300 (*) NEGATIVE mg/dL   Urobilinogen, UA 1.0  0.0 - 1.0 mg/dL   Nitrite NEGATIVE  NEGATIVE   Leukocytes, UA LARGE (*) NEGATIVE  URINE MICROSCOPIC-ADD ON      Result Value Ref Range   Squamous Epithelial / LPF RARE  RARE   WBC, UA TOO NUMEROUS TO COUNT  <3 WBC/hpf   RBC / HPF 7-10  <3 RBC/hpf   Bacteria, UA FEW (*) RARE   Urine-Other MUCOUS PRESENT    I-STAT CHEM 8, ED      Result Value Ref Range   Sodium 142  137 - 147 mEq/L   Potassium 4.2  3.7 - 5.3 mEq/L   Chloride 102  96 - 112 mEq/L   BUN 21  6 - 23 mg/dL   Creatinine, Ser 0.961.20 (*) 0.50 - 1.10 mg/dL   Glucose, Bld 045116 (*) 70 - 99 mg/dL   Calcium, Ion 4.091.28  8.111.13 - 1.30 mmol/L   TCO2 28  0 - 100 mmol/L   Hemoglobin 13.9  12.0 - 15.0 g/dL   HCT 91.441.0  78.236.0 - 95.646.0 %     Imaging Review Dg Chest 1 View  05/06/2013   CLINICAL DATA:  Status post fall.  EXAM: CHEST - 1 VIEW  COMPARISON:  Plain film of the chest 11/08/2007 and 03/01/2012.  FINDINGS: Lungs are clear. Heart size is normal. No pneumothorax or pleural effusion. Postoperative change of resection of the distal  right clavicle is noted.  IMPRESSION: No acute finding.   Electronically Signed   By: Drusilla Kannerhomas  Dalessio M.D.   On: 05/06/2013 21:23   Dg Hip Complete Right  05/06/2013   CLINICAL DATA:  Fall.  Right hip pain.  EXAM: RIGHT HIP - COMPLETE 2+ VIEW  COMPARISON:  None.  FINDINGS: No acute bony or joint abnormality is identified. Sclerotic lesion in the right ilium cannot be definitively characterized but is likely a bone island.  IMPRESSION: No acute finding.   Electronically Signed   By: Drusilla Kannerhomas  Dalessio M.D.   On: 05/06/2013 21:24   Ct Head Wo Contrast  05/06/2013   CLINICAL DATA:  Trauma, fall, with neck pain and head pain.  EXAM: CT HEAD WITHOUT CONTRAST  CT CERVICAL SPINE WITHOUT CONTRAST  TECHNIQUE: Multidetector CT imaging of the head and cervical spine was performed following the standard protocol without intravenous contrast. Multiplanar CT image reconstructions of the cervical spine were also generated.  COMPARISON:  07/22/2012 CT head without contrast.  FINDINGS: CT HEAD FINDINGS  No evidence for acute infarction, hemorrhage, mass lesion, hydrocephalus, or extra-axial fluid. Advanced atrophy. Chronic microvascular ischemic change. Remote left basal ganglia infarct.  Acute right paramedian occipital scalp hematoma. There is no underlying skull fracture. No contrecoup injury is seen. There is no sinus or mastoid disease. Negative orbits except for bilateral cataract extraction.  CT CERVICAL SPINE FINDINGS  There is no visible cervical spine fracture, traumatic subluxation, prevertebral soft tissue swelling, or intraspinal hematoma. Moderate osteopenia. Slight reversal normal cervical lordotic curve likely positional. Severe disc space narrowing C3-C6. Mild pannus. Moderate bilateral carotid calcification. No neck masses. No pneumothorax or lung apex nodule.  IMPRESSION: Atrophy and small vessel disease. Remote left basal ganglia infarct.  Acute right occipital scalp hematoma. No skull fracture or  intracranial hemorrhage  Advanced cervical spondylosis. No cervical spine fracture or traumatic subluxation.   Electronically Signed   By: Davonna Belling M.D.   On: 05/06/2013 21:19   Ct Cervical Spine Wo Contrast  05/06/2013   CLINICAL DATA:  Trauma, fall, with neck pain and head pain.  EXAM: CT HEAD WITHOUT CONTRAST  CT CERVICAL SPINE WITHOUT CONTRAST  TECHNIQUE: Multidetector CT imaging of the head and cervical spine was performed following the standard protocol without intravenous contrast. Multiplanar CT image reconstructions of the cervical spine were also generated.  COMPARISON:  07/22/2012 CT head without contrast.  FINDINGS: CT HEAD FINDINGS  No evidence for acute infarction, hemorrhage, mass lesion, hydrocephalus, or extra-axial fluid. Advanced atrophy. Chronic microvascular ischemic change. Remote left basal ganglia infarct.  Acute right paramedian occipital scalp hematoma. There is no underlying skull fracture. No contrecoup injury is seen. There is no sinus or mastoid disease. Negative orbits except for bilateral cataract extraction.  CT CERVICAL SPINE FINDINGS  There is no visible cervical spine fracture, traumatic subluxation, prevertebral soft tissue swelling, or intraspinal hematoma. Moderate osteopenia. Slight reversal normal cervical lordotic curve likely positional. Severe disc space narrowing C3-C6. Mild pannus. Moderate bilateral carotid calcification. No neck masses. No pneumothorax or lung apex nodule.  IMPRESSION: Atrophy and small vessel disease. Remote left basal ganglia infarct.  Acute right occipital scalp hematoma. No skull fracture or intracranial hemorrhage  Advanced cervical spondylosis. No cervical spine fracture or traumatic subluxation.   Electronically Signed   By: Davonna Belling M.D.   On: 05/06/2013 21:19     MDM   Final diagnoses:  Fall  Scalp hematoma  UTI (lower urinary tract infection)    80 yo F in NAD AFVSS non toxic appearing presents from [facility]  nursing home after a fall. Patient reportedly fell and had no LOC. She complained of R hip pain. She has baseline severe dementia. On arrival to ED patient is HDS with no complaints. Exam and hx limited given patients dementia but on exam it appears that passive ROM of R hip caused some discomfort. Palpation of hip did not seem to affect her. CT head and C spine ordered. CXR and R hip ordered. No midline C , T or L spine appreciated. No chest , abd, extremity pain noted other than the R hip.   10:13 PM Imaging with no acute fx or malalignment. NAICA. Patient is wheelchair bound according to nursing home staff. Repeat R hip exam with no pain with ROM or palpation. Doubt occult fracture. UA with UTI will send home with RX for Vantin.   Case co managed with my attending Dr. Romeo Apple.  Nadara Mustard, MD 05/06/13 [id number]

## 2013-05-06 NOTE — ED Notes (Signed)
[facility] Per [facility] staff patient is normally wheelchair bound with maximal assist to stand and pivot. MD informed.

## 2013-05-06 NOTE — ED Notes (Signed)
[facility] Patient fell unwitnessed while in activity room. Patient exhibited tenderness with palpation of neck and upper spine per EMS.

## 2013-05-07 NOTE — ED Provider Notes (Signed)
Medical screening examination/treatment/procedure(s) were conducted as a shared visit with resident physician and myself.  I personally evaluated the patient during the encounter.   EKG Interpretation  Date/Time:  Thursday May 06 2013 21:29:25 EST Ventricular Rate:  66 PR Interval:  155 QRS Duration: 96 QT Interval:  433 QTC Calculation: 454 R Axis:   24 Text Interpretation:  Sinus rhythm Probable left atrial enlargement Left ventricular hypertrophy Nonspecific T abnormalities, lateral leads No significant change since last tracing Confirmed by Terez Montee  MD, Diyari Cherne ([id number]) on 05/07/2013 12:12:17 AM      I interviewed and examined the patient. Lungs are CTAB. Cardiac exam wnl. Abdomen soft.  Pt hypertensive here, mildly bradycardic, VS otherwise unremarkable. Screening imaging non-contrib. Found to have UTI, will tx w/ Vantin. Will plan for d/c back to facility.   Junius ArgyleForrest S Denys Salinger, MD 05/07/13 [phone]

## 2013-05-07 NOTE — ED Notes (Signed)
MD Romeo AppleHarrison aware of client's blood pressure reading, and does not wish to treat at this time.

## 2013-05-17 ENCOUNTER — Emergency Department (HOSPITAL_COMMUNITY)
Admission: EM | Admit: 2013-05-17 | Discharge: 2013-05-17 | Disposition: A | Discharge status: Home / Self Care | Attending: Emergency Medicine | Admitting: Emergency Medicine

## 2013-05-17 ENCOUNTER — Encounter (HOSPITAL_COMMUNITY): Payer: Self-pay | Admitting: Emergency Medicine

## 2013-05-17 ENCOUNTER — Ambulatory Visit (HOSPITAL_COMMUNITY)

## 2013-05-17 DIAGNOSIS — W19XXXA Unspecified fall, initial encounter: Secondary | ICD-10-CM

## 2013-05-17 DIAGNOSIS — I10 Essential (primary) hypertension: Secondary | ICD-10-CM | POA: Insufficient documentation

## 2013-05-17 DIAGNOSIS — Z872 Personal history of diseases of the skin and subcutaneous tissue: Secondary | ICD-10-CM | POA: Insufficient documentation

## 2013-05-17 DIAGNOSIS — H3530 Unspecified macular degeneration: Secondary | ICD-10-CM | POA: Insufficient documentation

## 2013-05-17 DIAGNOSIS — S0003XA Contusion of scalp, initial encounter: Secondary | ICD-10-CM | POA: Insufficient documentation

## 2013-05-17 DIAGNOSIS — W1809XA Striking against other object with subsequent fall, initial encounter: Secondary | ICD-10-CM | POA: Diagnosis not present

## 2013-05-17 DIAGNOSIS — Z79899 Other long term (current) drug therapy: Secondary | ICD-10-CM | POA: Diagnosis not present

## 2013-05-17 DIAGNOSIS — I2510 Atherosclerotic heart disease of native coronary artery without angina pectoris: Secondary | ICD-10-CM | POA: Diagnosis not present

## 2013-05-17 DIAGNOSIS — F0390 Unspecified dementia without behavioral disturbance: Secondary | ICD-10-CM | POA: Insufficient documentation

## 2013-05-17 DIAGNOSIS — Z9861 Coronary angioplasty status: Secondary | ICD-10-CM | POA: Insufficient documentation

## 2013-05-17 DIAGNOSIS — E119 Type 2 diabetes mellitus without complications: Secondary | ICD-10-CM | POA: Diagnosis not present

## 2013-05-17 DIAGNOSIS — F329 Major depressive disorder, single episode, unspecified: Secondary | ICD-10-CM | POA: Diagnosis not present

## 2013-05-17 DIAGNOSIS — Z8673 Personal history of transient ischemic attack (TIA), and cerebral infarction without residual deficits: Secondary | ICD-10-CM | POA: Insufficient documentation

## 2013-05-17 DIAGNOSIS — S0083XA Contusion of other part of head, initial encounter: Principal | ICD-10-CM

## 2013-05-17 DIAGNOSIS — Y9389 Activity, other specified: Secondary | ICD-10-CM | POA: Insufficient documentation

## 2013-05-17 DIAGNOSIS — Z9889 Other specified postprocedural states: Secondary | ICD-10-CM | POA: Diagnosis not present

## 2013-05-17 DIAGNOSIS — Y9210 Unspecified residential institution as the place of occurrence of the external cause: Secondary | ICD-10-CM | POA: Insufficient documentation

## 2013-05-17 DIAGNOSIS — Z8701 Personal history of pneumonia (recurrent): Secondary | ICD-10-CM | POA: Diagnosis not present

## 2013-05-17 DIAGNOSIS — E785 Hyperlipidemia, unspecified: Secondary | ICD-10-CM | POA: Diagnosis not present

## 2013-05-17 DIAGNOSIS — S1093XA Contusion of unspecified part of neck, initial encounter: Secondary | ICD-10-CM | POA: Diagnosis present

## 2013-05-17 DIAGNOSIS — F3289 Other specified depressive episodes: Secondary | ICD-10-CM | POA: Diagnosis not present

## 2013-05-17 NOTE — ED Notes (Signed)
Per Valarie ConesGuertin, RN Per report, pt at baseline mentation, coming from activity center s/p fall.  Pt c/o pain to R eye, large hematoma noted to R eyebrow.  Pt also noted to have abrasion to L hand, bleeding controlled.  Skin otherwise PWD, no other obvious injuries or deformities.  RR even/un-lab, lsctab.  +bsx4 quads.  abd s/nt/nd.  MAEI, +csm/+pulses.

## 2013-05-17 NOTE — ED Provider Notes (Signed)
CSN: [id number]     Arrival date & time 05/17/13  1229 History   First MD Initiated Contact with Patient 05/17/13 1314     Chief Complaint  Patient presents with  . Fall  . hematoma to head      (Consider location/radiation/quality/duration/timing/severity/associated sxs/prior Treatment) HPI Level V caveat secondary to dementia. Patient presents from nursing home after witnessed fall. Per report patient no loss of consciousness, but fell forward onto her face. Patient suffered hematoma on the right brow.  Per report the patient has otherwise been in her usual state of health, and is interacting at baseline prior to transfer.   Past Medical History  Diagnosis Date  . Diabetes mellitus 06/2005    Type II  . Hypertension 1960  . Hyperlipidemia 02/2004  . Memory loss     history of dementia  . CVA (cerebral infarction)     x 3  . Stroke 2001  . Echocardiogram abnormal     a. 09/2003 EF 55-65%, Mild AR;  b. 05/2008 Echo - EF 66-70%, no RWMA, mid-cavity obliteration @ end systole, DD, mild AI/MR/TR, NL RV  . MRI of brain abnormal 09/15/2003    Old lunar infarct, tight L MVERT ART stenosis 40% RICA  . TIA (transient ischemic attack) 11/2007  . Pneumonia 11/2007  . Abnormal head CT 11/06/2007    Old L hemispheric stroke scattered white matter Dz no acute changes  . Abnormal head MRI 12/08/2007    No acute changes  . Abnormal MRA, head 11/08/2007    Stenosis dist R carotid <50% RICA 75-80% LICA <50%  . Chest pain 10/2008    R/O'd Epig. pain, shaking after Morphine  . Cellulitis 07/2009    RLL  . Macular degeneration   . CAD (coronary artery disease)     a. 04/2004 - PCI LAD - cypher DES to prox LAD, Vision BMS to mid-LAD, PTCA to D1;  b. 07/2004 - cath/PCI : LM-NL, LAD 32m ISR (treated with CBA), 70d, LCX minor irrges, RCA 20p, 30d;  c. 05/2008 Aden MV: EF 72%, no ischemia  . Dementia   . Dementia   . Carotid stenosis   . Hemiparesis   . Depression    Past Surgical History  Procedure  Laterality Date  . Appendectomy      18 YOA  . Shoulder surgery  2004    Left  . Adenosine myoview  03/08/2004    Mild isch EF 72%  . Cardiac catheterization  04/09/2004    Severe single vessel disease, tx medically  . Cardiac catheterization  05/02/2004    PTCA X 3  . Instent restenosis  5/10 - 07/28/2004    [facility]  . Cardiac catheterization  07/19/2004    EF 65% Instent restenosis PTCA  . Abdominal ct  07/20/2004    Incr in retroperitoneal hemm  . Adenosine myoview  10/17/2004    Mild isch dist ant wall EF 69%  . Adenosine myoview  09/10/2005    Low risk  . Eeg      Pos left frontal temp focus  . Adenosine cardiolite  09/16/2006    No change, normal  . Carotid duplex, bilateral  11/09/2007    40-60% ICA Stenosis L>R  . Modified barium swallow  11/09/2007    No aspiration  . Technetium myoview  05/11/08    Normal  . Lower extremity US  10/26/08    No DVT  . Cataract    . Heart stent  Family History  Problem Relation Age of Onset  . Aneurysm Mother   . Alcohol abuse Father   . Learning disabilities Sister   . Heart disease Sister     CAD  . Heart disease Brother   . Alcohol abuse Brother   . Alcohol abuse Brother   . Heart disease Brother     MI x 2   History  Substance Use Topics  . Smoking status: Never Smoker   . Smokeless tobacco: Never Used  . Alcohol Use: No   OB History   Grav Para Term Preterm Abortions TAB SAB Ect Mult Living                 Review of Systems  Unable to perform ROS: Dementia      Allergies  Morphine sulfate  Home Medications   Current Outpatient Rx  Name  Route  Sig  Dispense  Refill  . acetaminophen (TYLENOL) 325 MG tablet   Oral   Take 650 mg by mouth 3 (three) times daily. For pain         . acetaminophen (TYLENOL) 500 MG tablet   Oral   Take 500 mg by mouth as needed for fever.          . cefpodoxime (VANTIN) 100 MG tablet   Oral   Take 1 tablet (100 mg total) by mouth 2 (two) times daily.   20 tablet   0     BID  for ten days   . Cranberry 405 MG CAPS   Oral   Take 405 mg by mouth daily.          Marland Kitchen guaiFENesin (ROBITUSSIN) 100 MG/5ML SOLN   Oral   Take 5 mLs by mouth every 4 (four) hours as needed for cough or to loosen phlegm.         . haloperidol (HALDOL) 0.5 MG tablet   Oral   Take 0.5-1 mg by mouth 2 (two) times daily. Takes 0.5mg  in am and 1mg  in pm         . povidone-iodine (BETADINE) 10 % external solution   Topical   Apply 1 application topically 2 (two) times daily. Started 04/30/13, for 14 days ending 05/11/13          BP 181/76  Pulse 70  Temp(Src) 98.6 F (37 C) (Oral)  Resp 16  SpO2 100% Physical Exam  Nursing note and vitals reviewed. Constitutional: She appears well-developed and well-nourished. She appears listless. No distress.  HENT:  Head: Normocephalic and atraumatic.    Eyes: Conjunctivae and EOM are normal.  Cardiovascular: Normal rate and regular rhythm.   Pulmonary/Chest: Effort normal and breath sounds normal. No stridor. No respiratory distress.  Abdominal: She exhibits no distension.  Musculoskeletal: She exhibits no edema.  Neurological: She appears listless. No cranial nerve deficit.  Patient minimally interactive, though she does move all extremities spontaneously. She does not follow commands reliably.  Skin: Skin is warm and dry.  Psychiatric: She is slowed and withdrawn. Cognition and memory are impaired.    ED Course  Procedures (including critical care time) Labs Review Labs Reviewed - No data to display Imaging Review No results found.  I reviewed the chart  Pulse oximetry 100% room air normal  Update: On repeat exam the patient appears comfortable.  CT scan does not demonstrate fracture or intracranial hemorrhage.   MDM  Patient with dementia presents after a witnessed fall.  She does have a notable hematoma on her  forehead, but is otherwise in no distress, hemodynamically stable, seemingly at baseline.  Patient's CT scan  did not demonstrate acute new pathology.  Patient was discharged back to her nursing facility.    Gerhard Munchobert Talajah Slimp, MD 05/17/13 1436

## 2013-05-17 NOTE — ED Notes (Signed)
Bed: [id number] Expected date:  Expected time:  Means of arrival:  Comments: Darwish

## 2013-05-17 NOTE — ED Notes (Signed)
Dr. Jeraldine LootsLockwood aware of pupils.

## 2013-05-17 NOTE — ED Notes (Signed)
Per ems pt is from [facility]. Staff reports pt in activity room walking and fell forward, no LOC. Hematoma to right foreheard area. Initially complaining of generalized backpain.

## 2013-05-17 NOTE — ED Notes (Signed)
PTAR here to provide safe transport back to facility, nad upon leaving dept.

## 2013-05-17 NOTE — ED Notes (Signed)
ptar called and report called to [facility]

## 2013-06-03 ENCOUNTER — Ambulatory Visit: Payer: Medicare Other | Admitting: Podiatry

## 2013-08-02 IMAGING — CR DG CHEST 1V PORT
1 series · 1 of 1 positions shown · non-contrast
Comparison: none

REASON FOR EXAM: sob, chest pain
COMMENTS:

PROCEDURE:     DXR - DXR PORTABLE CHEST SINGLE VIEW  - July 16, 2011  [DATE]
RESULT:     The lungs are clear. The cardiovascular structures are prominent
without evidence of congestive heart failure.

[portable]
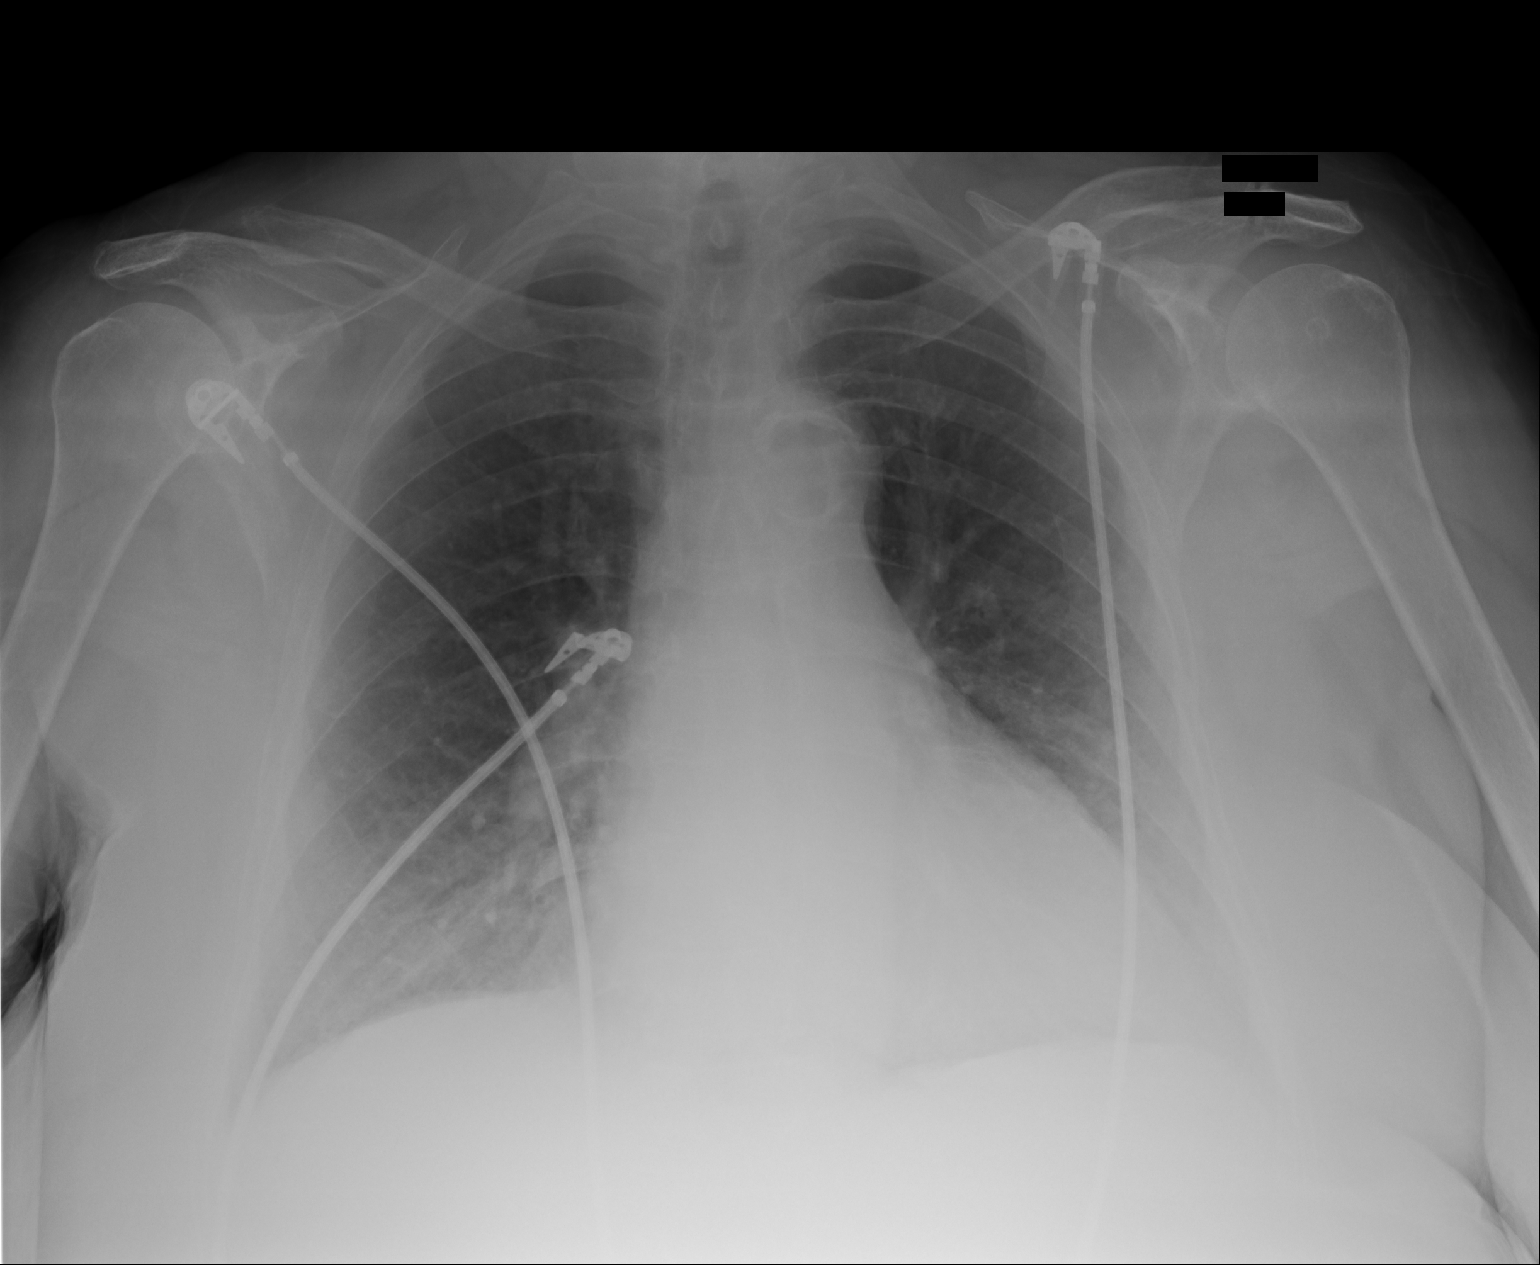

[1 of 1 positions shown; findings below may reference images not displayed]

IMPRESSION: Cardiomegaly. No congestive heart failure. No interim
change noted from 05/01/2011.

## 2013-09-08 DEATH — deceased

## 2013-10-17 IMAGING — CT CT HEAD W/O CM
1 of 2 series · 16 of 30 positions shown, 20 images · non-contrast
Comparison: 11/06/2007

CLINICAL DATA: Fall out of wheelchair.

CT HEAD WITHOUT CONTRAST
TECHNIQUE: Contiguous axial images were obtained from the base of
the skull through the vertex without contrast.

[Series 3: recon 2: brain · axial · 0.47mm/px · z∈[+34,+159]mm · 16 of 56 slices shown, 20 images]
[im 3/56  brain]
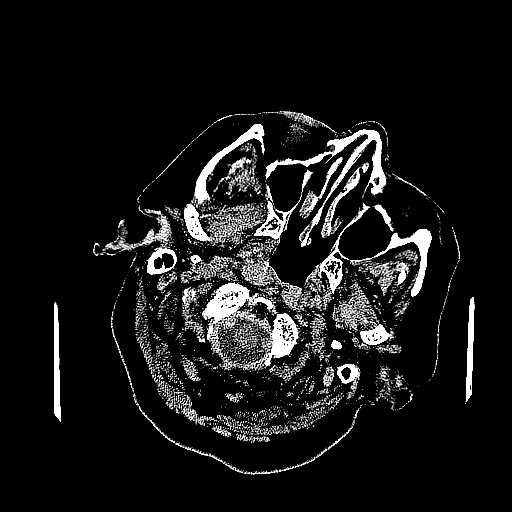
[im 3/56  bone]
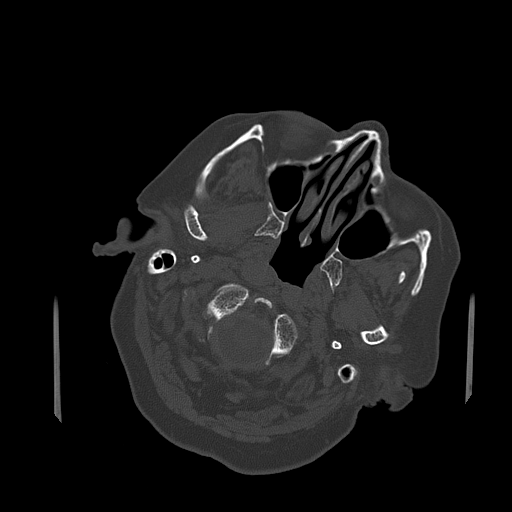
[im 6/56  brain]
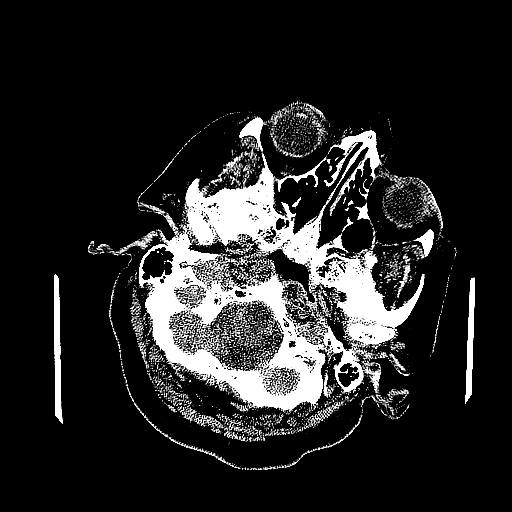
[im 9/56  brain]
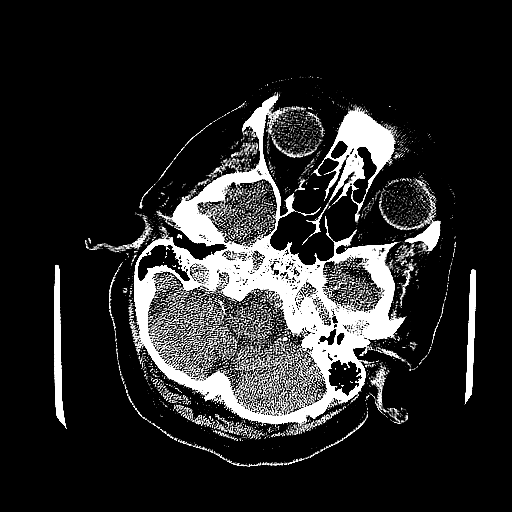
[im 12/56  brain]
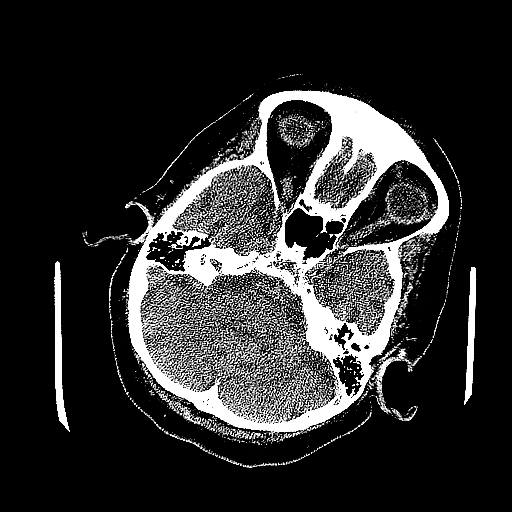
[im 18/56  brain]
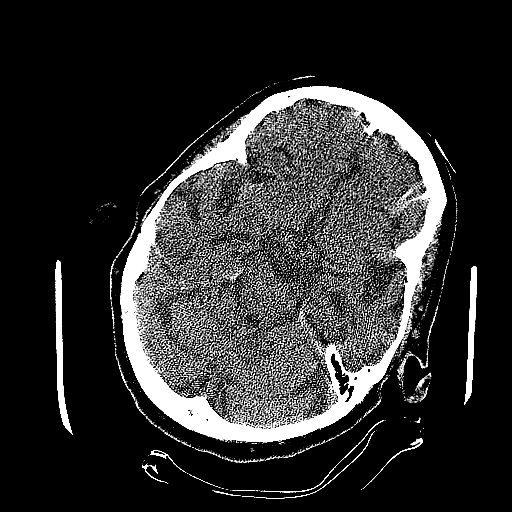
[im 18/56  bone]
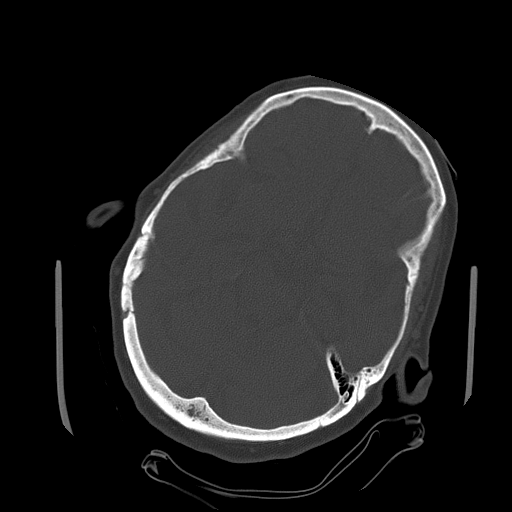
[im 21/56  brain]
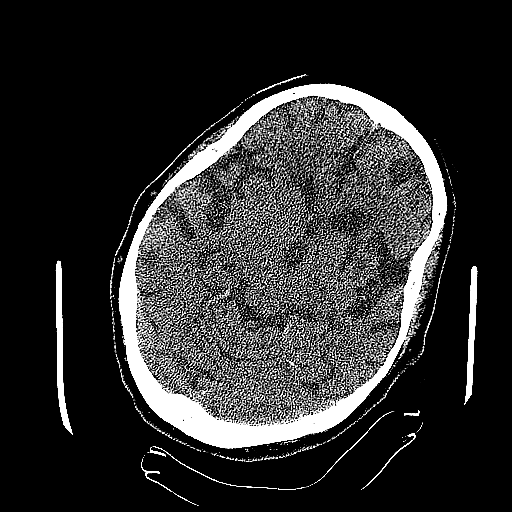
[im 24/56  brain]
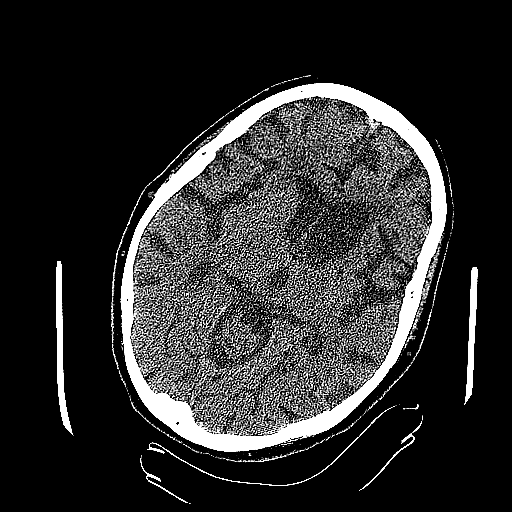
[im 27/56  brain]
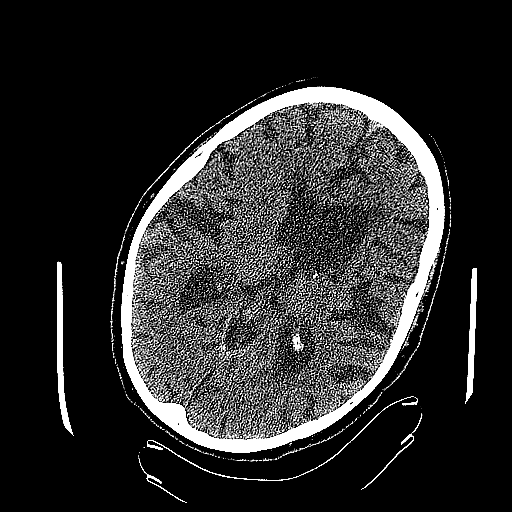
[im 29/56  brain]
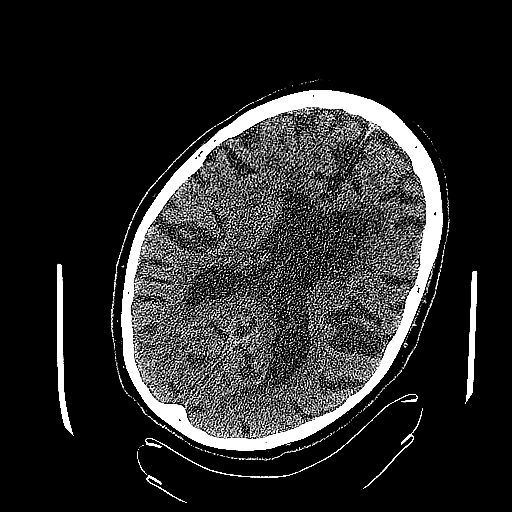
[im 29/56  bone]
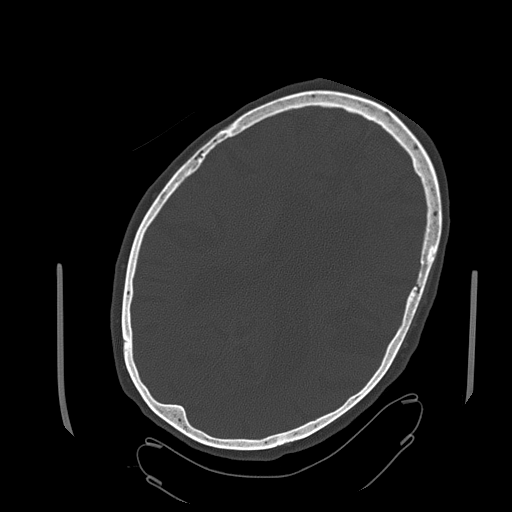
[im 32/56  brain]
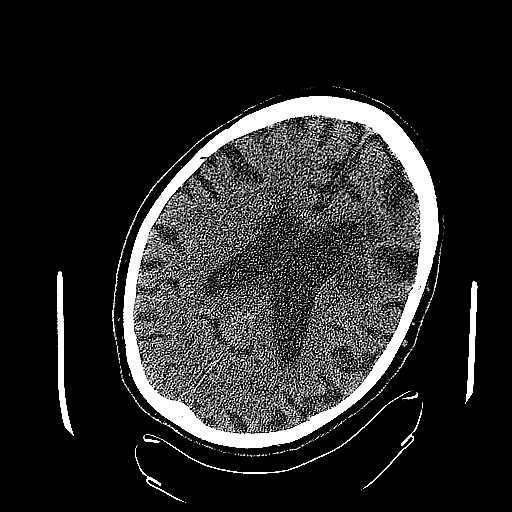
[im 35/56  brain]
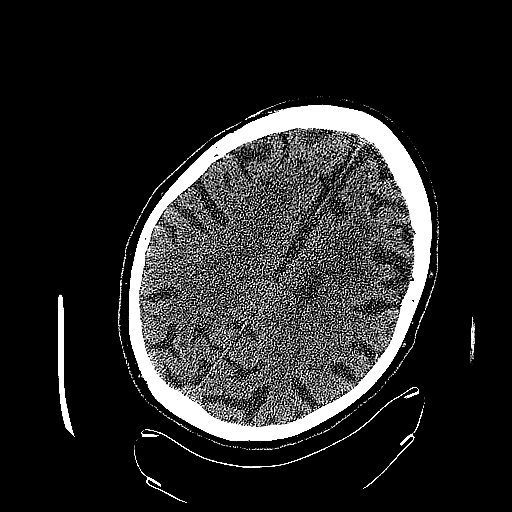
[im 38/56  brain]
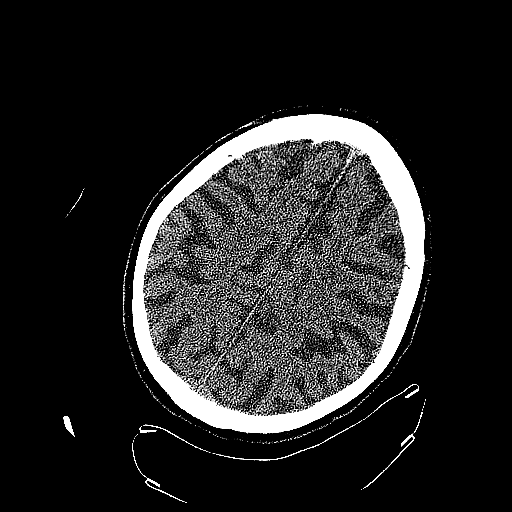
[im 44/56  brain]
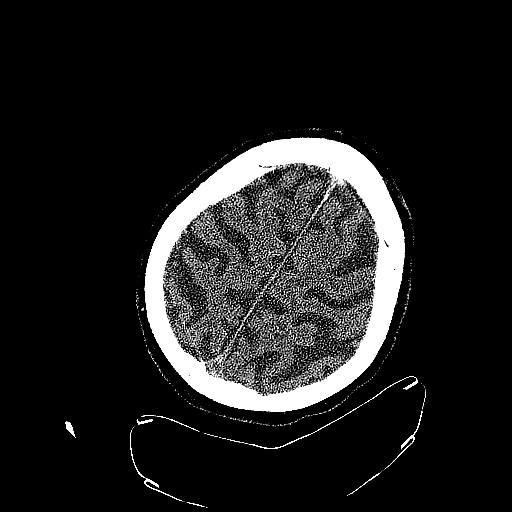
[im 44/56  bone]
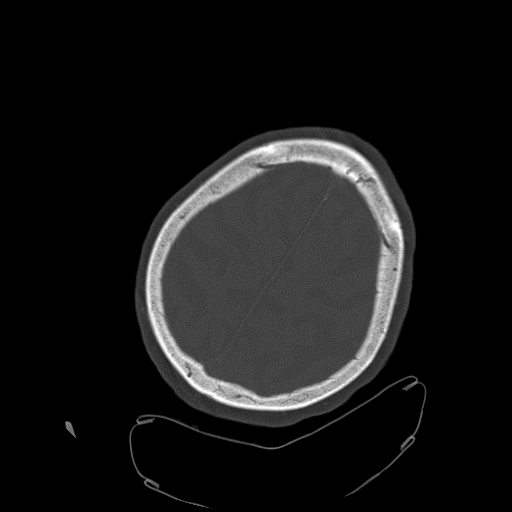
[im 47/56  brain]
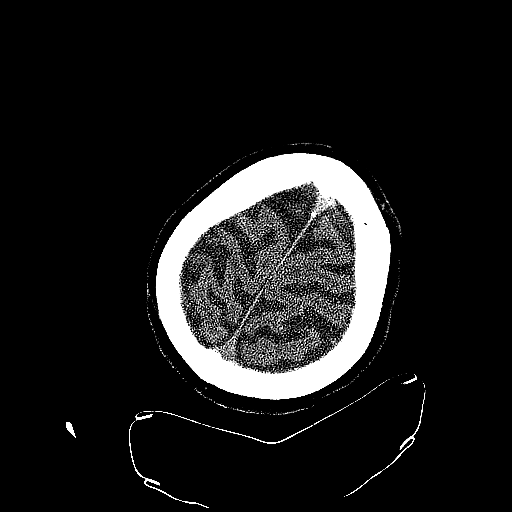
[im 50/56  brain]
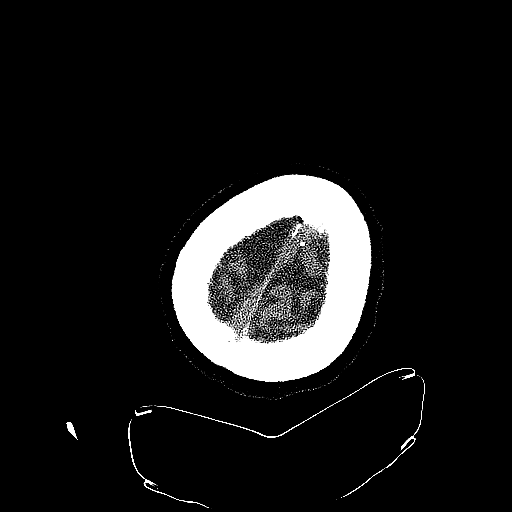
[im 53/56  brain]
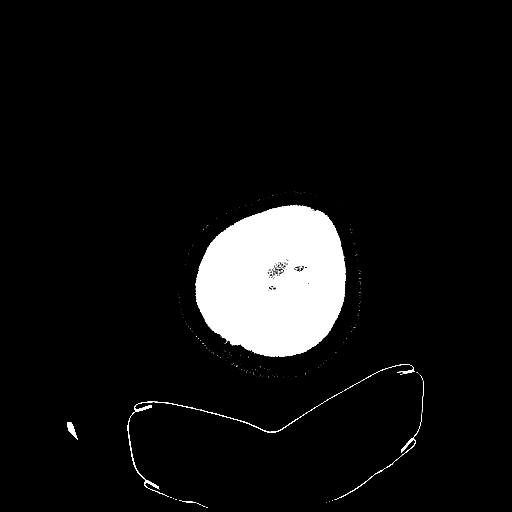

[16 of 30 positions shown; findings below may reference images not displayed]

FINDINGS: The brain shows advanced atrophy and small vessel
ischemic changes.  Stable appearance of an old white matter infarct
in the left frontal lobe with associated dilatation of the adjacent
lateral ventricle.

The brain demonstrates no evidence of hemorrhage, acute cortical
infarction, edema, mass effect, extra-axial fluid collection,
hydrocephalus or mass lesion.  The skull is unremarkable.
IMPRESSION: No acute findings.  Atrophy, small vessel disease and old left
frontal infarct.

## 2013-10-18 IMAGING — US US RENAL PORT
1 series · 14 of 25 positions shown · non-contrast
Comparison: None

CLINICAL DATA: Acute renal failure.

RENAL/URINARY TRACT ULTRASOUND COMPLETE

[Series 1: us renal port · 0.25mm/px · 14 of 57 slices shown]
[im 1/57]
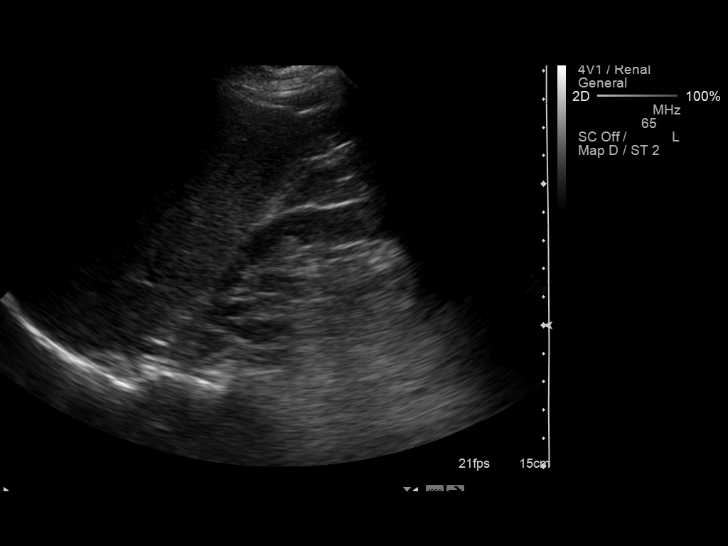
[im 5/57]
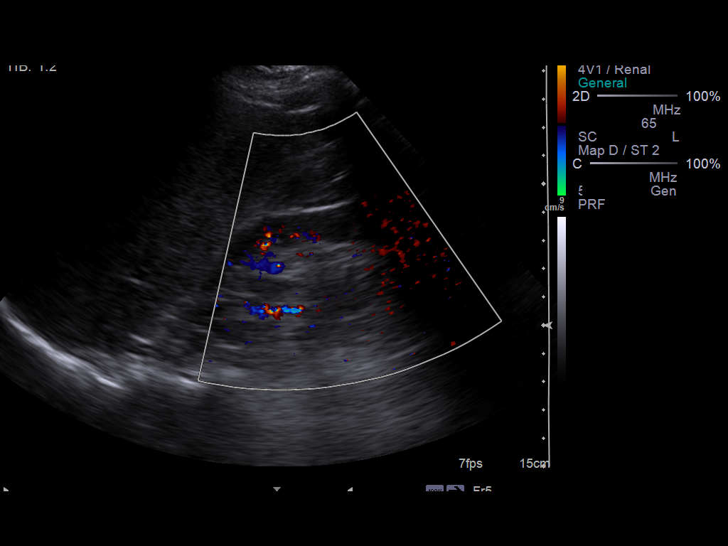
[im 10/57]
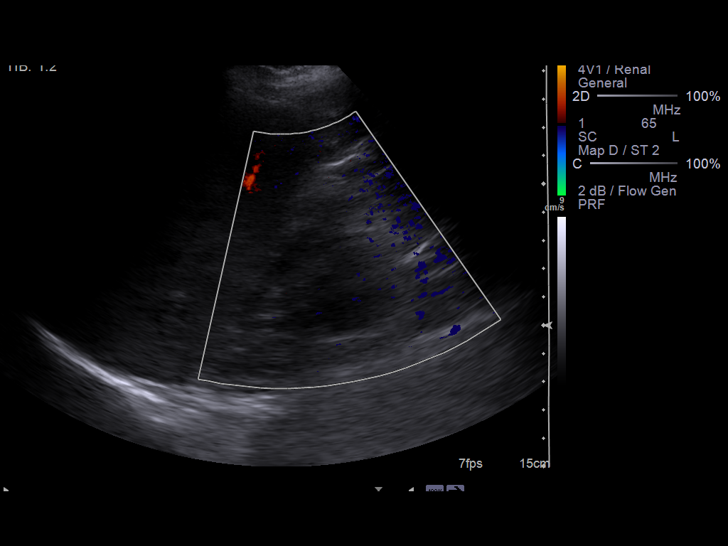
[im 15/57]
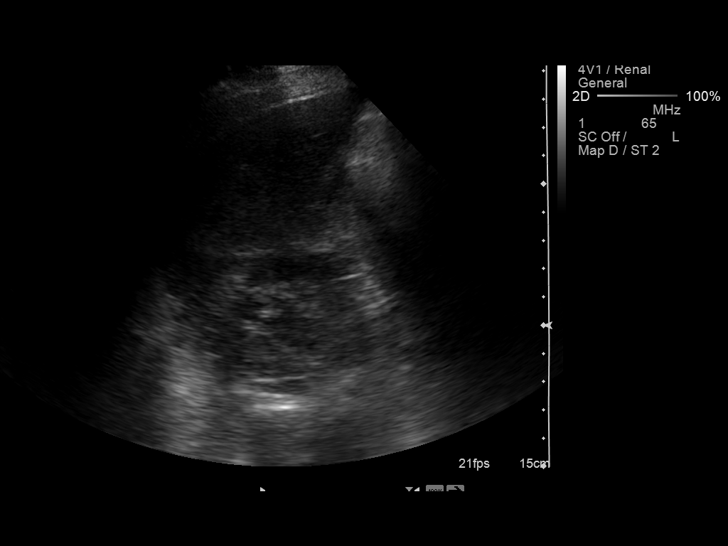
[im 19/57]
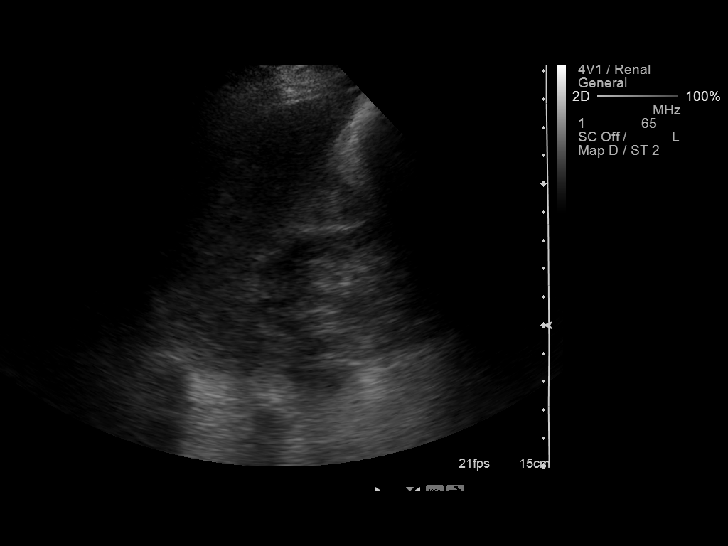
[im 22/57]
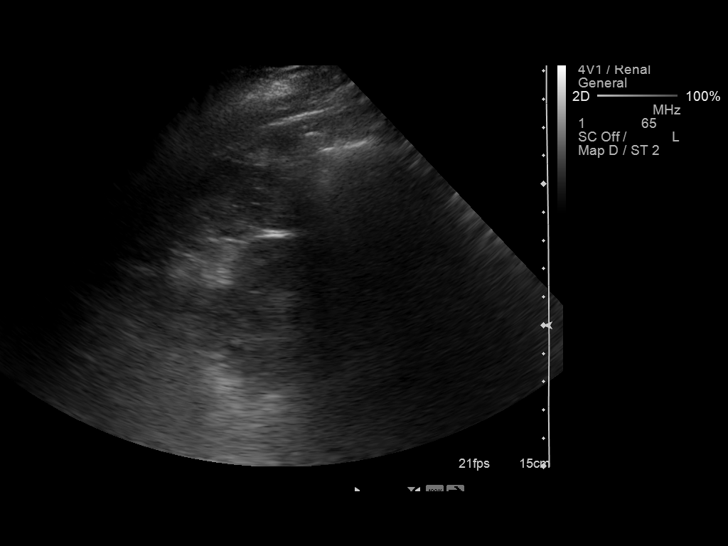
[im 26/57]
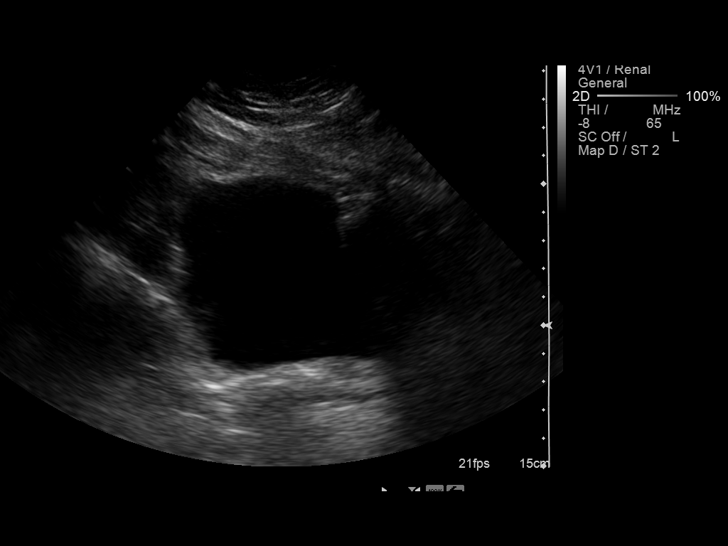
[im 31/57]
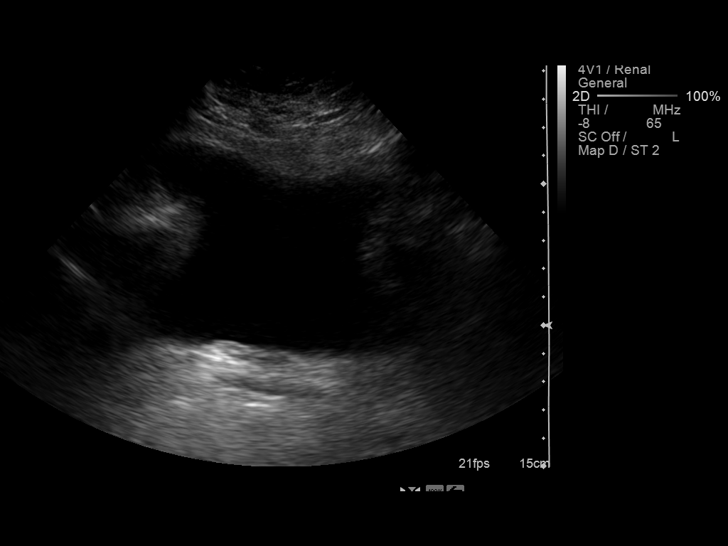
[im 36/57]
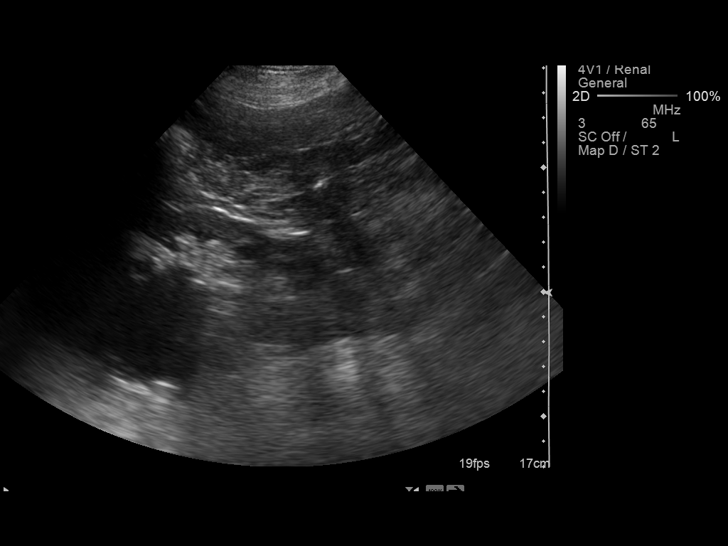
[im 38/57]
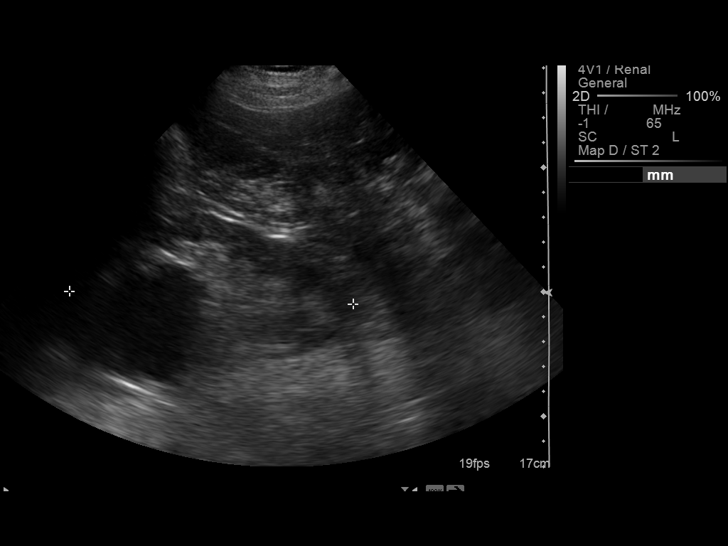
[im 43/57]
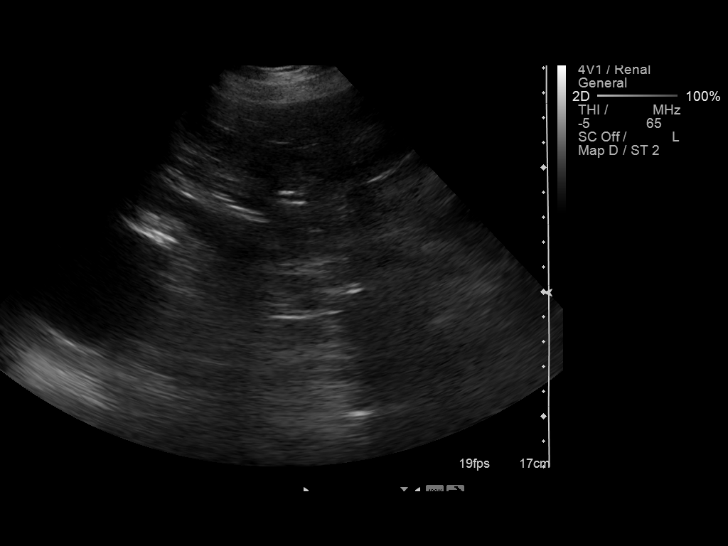
[im 47/57]
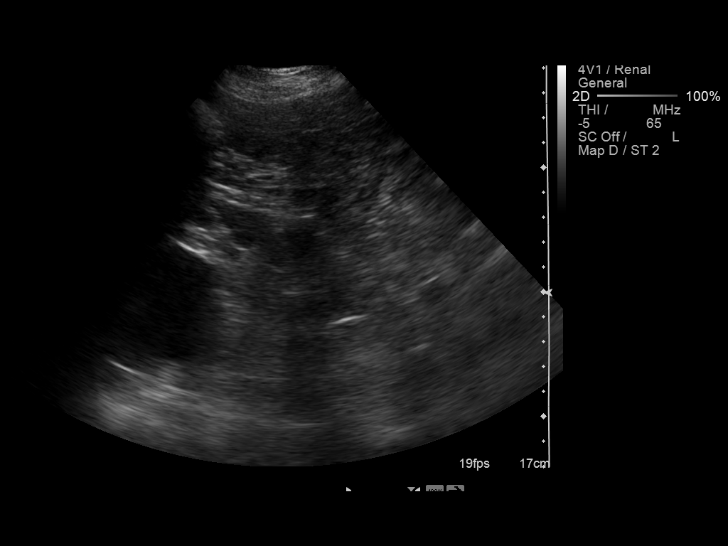
[im 52/57]
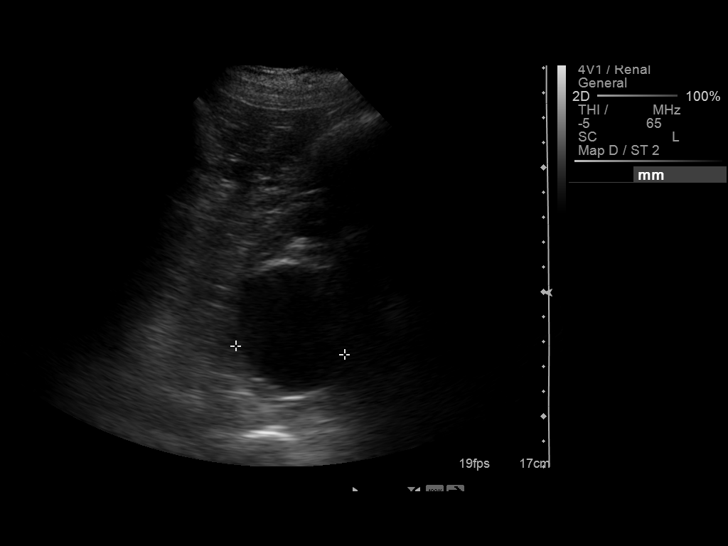
[im 57/57]
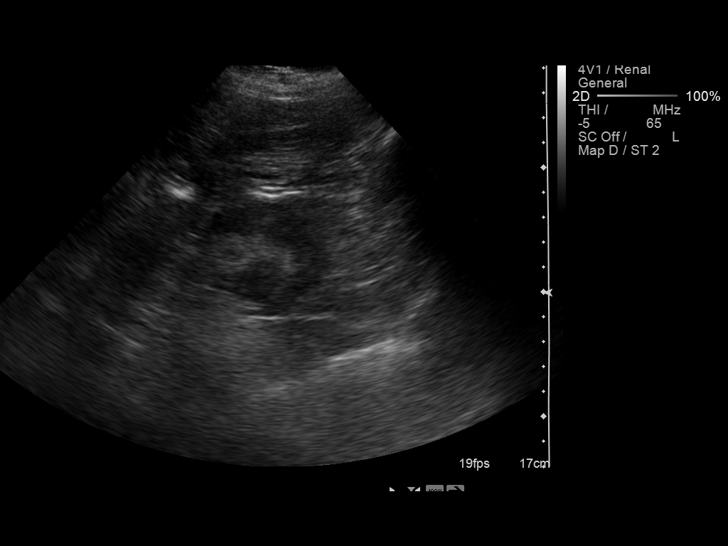

[14 of 25 positions shown; findings below may reference images not displayed]

FINDINGS: Right Kidney:  7.8 cm in length.  Mild renal cortical thinning is
noted.  No hydronephrosis.  A small cyst is demonstrated.

Left Kidney:  11.4 cm in length.  Normal renal echogenicity.  Mild
renal cortical thinning.  No hydronephrosis.  A 4.9 x 5.2 x 4.4 cm
mid pole cyst is noted.

Bladder:  Normal.
IMPRESSION: 1.  Mild renal cortical thinning but no hydronephrosis.
2.  Bilateral cysts.

## 2014-07-03 NOTE — Discharge Summary (Signed)
PATIENT NAME:  Angela [name], Angela G MR#:  [id number] DATE OF BIRTH:  1925-10-24  DATE OF ADMISSION:  05/01/2011 DATE OF DISCHARGE:  05/02/2011  PRIMARY CARE PHYSICIAN: Dr. Para Marchuncan  CARDIOLOGIST: Julien Nordmannimothy Gollan, MD  DISCHARGE DIAGNOSIS: Chest pain, likely noncardiac, possibly musculoskeletal. Negative serial cardiac enzymes. Pain was reproducible and the chest wall was tender on palpation.   SECONDARY DIAGNOSES:  1. Coronary artery disease.  2. History of cerebrovascular accident. 3. Dementia. 4. Hypertension.  5. Hyperlipidemia.  6. Macular degeneration.   CONSULTANT: Julien Nordmannimothy Gollan, MD - Cardiology.  PROCEDURES/RADIOLOGY: Chest x-ray on 05/01/2011 showed no acute cardiopulmonary disease.   HISTORY AND SHORT HOSPITAL COURSE: The patient is an 81 year old female with the above-mentioned medical problem who was admitted for severe chest pain. EKG was showing sinus bradycardia, no ischemia. She was evaluated by cardiology, Dr. Mariah MillingGollan, who recommended ruling her out with serial cardiac enzymes. She had negative two sets of cardiac enzymes. After discussion with cardiology, Dr. Mariah MillingGollan, she was discharged home in stable condition. She did continue to have some chest pain which was thought to be musculoskeletal as her pain was reproducible on exam and her chest wall was quite tender on palpation. Her discharge planning was discussed with her daughter who was in agreement.      DISCHARGE PHYSICAL EXAMINATION:   VITAL SIGNS: On the date of discharge, her temperature was 98.2, heart rate 62 per minute, respirations 28 per minute, blood pressure 151/70 mmHg, and she was saturating 94% on room air.   CARDIOVASCULAR: S1 and S2 normal. No murmurs, rubs, or gallops.   ABDOMEN: Soft and benign.   NEUROLOGIC: Nonfocal examination.   LUNGS: Clear to auscultation bilaterally. No wheezing, rales, rhonchi, or crepitation. All other physical examination remained at baseline.   DISCHARGE MEDICATIONS:   1. Multivitamin 1 tablet p.o. daily. 2. Aspirin 81 mg p.o. daily. 3. Plavix 75 mg p.o. daily.  4. Lisinopril 40 mg p.o. daily.  5. Ranitidine 150 mg p.o. daily. 6. Levetiracetam 500 mg p.o. twice a day. 7. Vitamin D3 1000 international units p.o. twice a day. 8. Crestor 10 mg p.o. at bedtime.  9. Donepezil 10 mg p.o. daily at bedtime. 10. Mirtazapine 15 mg 1/2 tablet p.o. daily.  11. Colace 100 mg p.o. twice a day as needed.  12. Mapap 325 mg p.o. every four hours as needed.  13. Fasting blood sugar to be checked with each meal or any hypoglycemia symptoms.  14. Metoprolol 12.5 mg p.o. twice a day. 15. Amlodipine 10 mg p.o. daily.  16. Toradol 10 mg p.o. every six hours as needed.  17. Nitroglycerin 0.4 mg sublingual every five minutes as needed.   DISCHARGE DIET: Low sodium.   DISCHARGE ACTIVITY: As tolerated.   DISCHARGE INSTRUCTIONS AND FOLLOW-UP: The patient was instructed to follow-up with her primary care physician, Dr. Para Marchuncan, in 1 to 2 weeks. She will need follow-up with Dr. Mariah MillingGollan early next week. She was instructed to call her primary care physician/cardiologist if her chest pain worsens or is associated with sweating or shortness of breath.   TOTAL TIME DISCHARGING THIS PATIENT: 45 minutes. ____________________________ Ellamae SiaVipul S. Sherryll BurgerShah, MD vss:slb D: 05/03/2011 23:58:53 ET T: 05/04/2011 14:07:01 ET JOB#: [id number]  cc: Isley Zinni S. Sherryll BurgerShah, MD, <Dictator> Dr. Leona Carryuncan Timothy Gollan, MD Ellamae SiaVIPUL S [facility] MD ELECTRONICALLY SIGNED 05/04/2011 19:18

## 2014-07-03 NOTE — H&P (Signed)
PATIENT NAME:  Angela [name], Angela [name] MR#:  [id number] DATE OF BIRTH:  Jan 22, 1926  DATE OF ADMISSION:  05/01/2011  PRIMARY CARE PHYSICIAN: On the MAR it is listed as Dr. Crawford GivensGraham Duncan.  History was obtained from the patient and the son.   CHIEF COMPLAINT: Chest pain.   HISTORY OF PRESENT ILLNESS: This is an 81 year old female who has history of coronary artery disease, history of drug-eluting stent to the proximal LAD and bare metal stent to mid LAD, history of dementia, history of cerebrovascular accident times three, macular degeneration, hypertension, and hyperlipidemia. She was last admitted for chest pain in August 2010. [facility]. She is not sure whether she follows with cardiology any more or not. Today she presented with severe chest pain. She said she usually has chest pain but this was the worst kind of chest pain she has had. She is complaining of chest pain across the chest radiating to her left arm. She was complaining of some shortness of breath also. No nausea or diaphoresis. She was sent to the Emergency Room because of unrelieved chest pain. She got 324 mg of aspirin in the Emergency Room and a nitroglycerin patch, 0.3 mg. She says her chest pain is better now. She is a poor historian because of dementia. She denies any abdominal pain. She denies any urinary or bowel complaints. Her chest pain was at rest, she says before lunch, and it lasted about an hour as per the patient. She is being admitted for chest pain with history of underlying coronary artery disease with previous stent placements.   REVIEW OF SYSTEMS: Limited as the patient is a poor historian. She denies any fever or weakness. She has poor vision from macular degeneration but no change. No ear pain or hearing loss. No cough. No dyspnea. She presents with chest pain. No dyspnea on exertion, palpitations, or syncope.  No nausea, vomiting, or abdominal pain. No dysuria. No frequency.  No thyroid problems. No  anemia.  No rash. No joint pain. She has some leg swelling. No focal numbness or weakness or anxiety.   PAST MEDICAL HISTORY:  1. Coronary artery disease. As stated in the history of present illness, she had a drug-eluting stent to the proximal LAD, bare metal stent to the mid LAD with cutting balloon angioplasty, mid LAD stent in 2006. Her last stress test as per cardiology records was in 05/2008. It showed no ischemia with ejection fraction of 72%. Last echo was in 05/2008 with ejection fraction of 65 to 70% with some diastolic dysfunction. She was last admitted for chest pain in 10/2008. She  ruled out for myocardial infarction at that time. 2. History of cerebrovascular accidents times three. 3. Dementia.  4. Hypertension.  5. Hyperlipidemia.  6. Macular degeneration.   PAST SURGICAL HISTORY: 1. Cataracts.  2. Appendectomy.   ALLERGIES TO MEDICATIONS: None.   [facility] MEDICATIONS[facility]  [facility]: 1. Amlodipine 5 mg at bedtime.  2. Aspirin 81 mg daily.  3. Plavix 75 mg daily. 4. Crestor 10 mg at bedtime. 5. Colace 100 mg b.i.d. as needed for constipation.  6. Donepezil 10 mg at bedtime.  7. Fasting blood sugar as needed. 8. Levetiracetam (Keppra) 500 mg b.i.d.  9. Lisinopril 40 mg daily.  10. Tylenol 325 mg q. 4 hours p.r.n.  11. Metoprolol 25 mg b.i.d.  12. Mirtazapine 7.5 mg at bedtime. 13. Multivitamin daily. 14. Ranitidine 150 mg before breakfast. 15. Vitamin D 1000 units twice  a day.   SOCIAL HISTORY: She has lived at [facility] for the last four years.  No history of smoking or alcohol use.   FAMILY HISTORY: History of heart disease in her mother. As per previous records, mother died at 1 of myocardial infarction. Father died of unknown cause, possibly alcohol related. One sister with coronary artery disease.    PHYSICAL EXAMINATION:  VITAL SIGNS: When she presented to the Emergency Room, temperature 96.5, heart rate 58, respirations 18, blood  pressure 163/71, saturating 97% on room air.   GENERAL: This is an elderly Caucasian female, pleasantly demented, comfortably sitting in bed.  No active chest pain right now.   HEENT: Bilateral pupils are unequal, not reactive. Extraocular muscles intact. No scleral icterus. No conjunctivitis. Oral mucosa is moist. No pallor.   NECK: No thyroid tenderness, enlargement, or nodule. Neck is supple. No masses, nontender. No adenopathy. No JVD. No carotid bruit.   CHEST: Bilateral breath sounds are clear. No wheeze. Normal effort. No respiratory distress.   HEART: Heart sounds are regular. No murmur. Peripheral pulses 1+. Bilateral lower extremity edema.   ABDOMEN: Soft, nontender. Normal bowel sounds. No hepatomegaly. No bruit. No masses.   RECTAL: Deferred.   NEUROLOGIC: She is awake, alert. She has dementia. She knows that she lives at [facility] but she does not know the date of birth. She does not know the month or year. Moving all extremities against gravity.   EXTREMITIES: No cyanosis. No clubbing.   SKIN: No rash. No lesions. She has some lower extremity edema which is chronic.   LABS/STUDIES: White count 5.2, hemoglobin 12.2, platelet count 147,000. BMP: Sodium 143, potassium 4.2, BUN 20, creatinine 1.28. Troponin negative. Chest x-ray is negative. EKG shows that she has sinus bradycardia at 57 but no acute ischemic changes.  Unchanged from prior EKG.  IMPRESSION:  1. Chest pain, rule out myocardial infarction.  2. Coronary artery disease with previous stent placement.  3. Hypertension.  4. Hyperlipidemia.  5. History of cerebrovascular accidents times three.  6. Dementia.   PLAN:  81 year old female who lives at [facility] with history of dementia, hypertension, hyperlipidemia, coronary artery disease, and previous cerebrovascular accident. She had a drug-eluting stent and bare metal stent in the LAD in 2006.  She is on a good regimen of medications with aspirin,  Plavix, statin, beta blocker, and ACE inhibitor. Today she comes in with severe chest pain that was not relieved. She was given 324 mg of aspirin in the Emergency Room and put on nitro paste. We will continue that. We will cycle her cardiac enzymes. We will continue her regimen of aspirin, Plavix, statin, beta blocker, and ACE inhibitor. If her enzymes are positive then she may need some anticoagulation. Her EKG does not show any acute ischemia. We will call a cardiology consult with [facility] cardiology who has seen her in the past. We will admit her observation, telemetry. We will continue her donepezil and Remeron for dementia.  The plan was discussed with the patient and her son.    TIME SPENT ON ADMISSION AND COORDINATION OF CARE:  50 minutes.  ____________________________ Fredia Sorrow, MD ag:bjt D: 05/01/2011 17:10:15 ET T: 05/01/2011 17:41:35 ET JOB#: [id number]  cc: Fredia Sorrow, MD, <Dictator> Arta Silence, MD Crawford Givens, MD  Fredia Sorrow MD ELECTRONICALLY SIGNED 05/14/2011 12:19

## 2014-07-03 NOTE — Consult Note (Signed)
General Aspect 81 yo WF with history of HTN, hyperlipidemia, CAD s/p drug eluting stent to proximal LAD and bare metal stent mid LAD with cutting balloon angioplasty mid LAD stent 07/19/04, mild dementia, CVA x 3 admitted with c/o of substernal chest pressue. Cardiology was consulted for chest pain.  She is a poor historian secondary to dementia.  She reporrts having chest pain that started yesterday. She "has chest pain all the time".   This AM she has mild discomfort that is worsened with palpation. Chest pain on last admission also was worse with palpation. She reports being active, has a walker. "I sit with a friend all day. She does puzzles".  Her last stress test was in March of 2010 and showed no ischemia with EF of 72% (adenosine). Last echo March 2010 with normal LV size and function, EF 65-70%, no RWMA, mid cavity obliteration at end systole,  diastolic dysfunction, mildy calcified AV with no AS and mild AI. There was mild MR and mild TR. RV systolic function was normal.    Last cath 5/06 with normal Left main, patent stent proximal LAD, 99% in stent restenosis mid LAD (treated with cutting balloon that day), 70% distal LAD, small Circumflex with minor luminal irregularities, large dominant RCA with 20 % proximal stenosis, 30% distal stenosis.    Present Illness . SOCIAL HISTORY: She has lived at [facility] for the last four years.  No history of smoking or alcohol use.   FAMILY HISTORY: History of heart disease in her mother. As per previous records, mother died at 44 of myocardial infarction. Father died of unknown cause, possibly alcohol related. One sister with coronary artery disease.   Physical Exam:   GEN WD, WN, NAD, tenderness with palpation of her chest    HEENT red conjunctivae    NECK supple  No masses    RESP normal resp effort  clear BS    CARD Regular rate and rhythm  Murmur    Murmur Systolic    Systolic Murmur Out flow    ABD denies tenderness  soft     LYMPH negative neck    EXTR negative edema    SKIN normal to palpation    NEURO motor/sensory function intact    PSYCH alert, A+O to time, place, person, good insight   Review of Systems:   Subjective/Chief Complaint chest pain    General: Weakness    Skin: No Complaints    ENT: No Complaints    Eyes: No Complaints    Neck: No Complaints    Respiratory: No Complaints    Cardiovascular: Chest pain or discomfort    Gastrointestinal: No Complaints    Genitourinary: No Complaints    Vascular: No Complaints    Musculoskeletal: No Complaints    Neurologic: No Complaints    Hematologic: No Complaints    Endocrine: No Complaints    Psychiatric: No Complaints    Review of Systems: All other systems were reviewed and found to be negative    Medications/Allergies Reviewed Medications/Allergies reviewed     cva:    tia:    mi:    cad:    Hypercholesterolemia:    Diabetes:    Dementia:        Admit Diagnosis:   CHEST PAIN CAD HTN DEMENTIA: 01-May-2011, Active, CHEST PAIN CAD HTN DEMENTIA  Home Medications: Medication Instructions Status  multivitamin 1 tab(s) orally once a day at 7am.  **same as tab-a-vite** Active  aspirin 81 mg  oral tablet, chewable 1 tab(s) orally once a day at 8am. Active  clopidogrel 75 mg oral tablet 1 tab(s) orally once a day at 8am.  **brand name plavix** Active  lisinopril 40 mg oral tablet 1 tab(s) orally once a day at 8am.  **brand name prinivil** Active  ranitidine 150 mg oral capsule 1 cap(s) orally once a day before breakfast at 8am.  **brand name zantac** Active  levetiracetam 500 mg oral tablet 1 tab(s) orally 2 times a day (7am, 8pm). **brand name keppra** Active  metoprolol tartrate 25 mg oral tablet 1 tab(s) orally 2 times a day (7am, 5pm). **brand name lopressor** Active  Vitamin D3 1000 intl units oral tablet 1 tab(s) orally 2 times a day (7am, 8pm). Active  amlodipine 5 mg oral tablet 1 tab(s) orally once a  day (at bedtime) at 8pm.  **brand name norvasc** Active  Crestor 10 mg oral tablet 1 tab(s) orally once a day (at bedtime) at 8pm. Active  donepezil 10 mg oral tablet 1 tab(s) orally once a day (at bedtime) at 8pm. **brand name aricept** Active  mirtazapine 15 mg oral tablet 0.5 tab (7.53m) orally once a day (at bedtime) at 8pm.  **brand name remeron** Active  docusate sodium 100 mg oral capsule 1 cap(s) orally 2 times a day as needed for constipation.  **brand name colace** Active  Mapap 325 mg oral tablet 1 tab(s) orally every 4 hours as needed for pain or fever.  **same as tylenol**  Active  fsbs  FSBS as needed-If signs/symptoms of hyper/hypoglycemia give oj/snack if <60. Notify md if it does not rise to 100. notify md if <60 or >400. Active     Routine Chem:  21-Feb-13 03:51    Glucose, Serum 101   BUN 19   Creatinine (comp) 1.25   Sodium, Serum 145   Potassium, Serum 4.2   Chloride, Serum 109   CO2, Serum 27   Calcium (Total), Serum 9.2   Anion Gap 9   Osmolality (calc) 291   eGFR (African American) 53   eGFR (Non-African American) 43   Cholesterol, Serum 125   Triglycerides, Serum 145   HDL (INHOUSE) 36   VLDL Cholesterol Calculated 29   LDL Cholesterol Calculated 60   EKG:   Interpretation EKG shows NSR with no significant ST or T wave changes    Rate 57   Radiology Results: XRay:    20-Feb-13 14:00, Chest Portable Single View   Chest Portable Single View    REASON FOR EXAM:    CHEST PAIN  COMMENTS:       PROCEDURE: DXR - DXR PORTABLE CHEST SINGLE VIEW  - May 01 2011  2:00PM     RESULT: Comparison is made to the prior exam of 04/03/2010. The   inspiratory level is less than optimal. The lung fields are clear. No   acute changes of the heart or pulmonary vasculature are seen. Monitoring   electrodes are present.    IMPRESSION:     No acute changes are identified.    Thank you for the opportunity to contribute to the care of your patient.     Verified By:  JDionne AnoWALL, M.D., MD    No Known Allergies:   Vital Signs/Nurse's Notes: **Vital Signs.:   21-Feb-13 05:55   Vital Signs Type Routine   Temperature Temperature (F) 97.6   Celsius 36.4   Temperature Source oral   Pulse Pulse 52   Pulse source per Dinamap   Respirations  Respirations 18   Systolic BP Systolic BP 471   Diastolic BP (mmHg) Diastolic BP (mmHg) 82   Mean BP 103   BP Source Dinamap   Pulse Ox % Pulse Ox % 95   Pulse Ox Activity Level  At rest   Oxygen Delivery Room Air/ 21 %     Impression 81 yo WF with history of HTN, hyperlipidemia, CAD s/p drug eluting stent to proximal LAD and bare metal stent mid LAD with cutting balloon angioplasty mid LAD stent 07/19/04, mild dementia, CVA x 3 admitted with c/o of substernal chest pressue.   A/P: 1) Chest pain Known CAD, some atypical features. Patient reports frequent chest pain Some reproducible chest pain with palpation this AM EKG normal cardiac enz negative so far. One set pending this AM I have talked with the daughter. Would suggest holding off on stress testing at this time given atypical features. --Could try NTG prn, hot pack on chest. Daughter would like NTG script  --If she continues to have chest pain, could schedule stress test as an outpt. Daughter has the [place] cardi[facility] office number.  2)Bradycardia Would decrease metoprolol to 12.5 mg po BID  3) CAD, h/p PCI would continue medical management for now.  4) Dementia: per the office notes, stable   Electronic Signatures: Ida Rogue (MD)  (Signed 21-Feb-13 09:03)  Authored: General Aspect/Present Illness, History and Physical Exam, Review of System, Past Medical History, Health Issues, Home Medications, Labs, EKG , Radiology, Allergies, Vital Signs/Nurse's Notes, Impression/Plan   Last Updated: 21-Feb-13 09:03 by Ida Rogue (MD)
